# Patient Record
Sex: Female | Born: 1975 | Race: White | Hispanic: No | Smoking: Former smoker
Health system: Southern US, Community
[De-identification: ages and names within clinical notes are randomized; demographics above are authoritative.]

## PROBLEM LIST (undated history)

## (undated) DIAGNOSIS — E039 Hypothyroidism, unspecified: Secondary | ICD-10-CM

## (undated) DIAGNOSIS — O24419 Gestational diabetes mellitus in pregnancy, unspecified control: Secondary | ICD-10-CM

## (undated) DIAGNOSIS — M797 Fibromyalgia: Secondary | ICD-10-CM

## (undated) DIAGNOSIS — Q249 Congenital malformation of heart, unspecified: Secondary | ICD-10-CM

## (undated) DIAGNOSIS — J45909 Unspecified asthma, uncomplicated: Secondary | ICD-10-CM

## (undated) DIAGNOSIS — O99345 Other mental disorders complicating the puerperium: Principal | ICD-10-CM

## (undated) DIAGNOSIS — G809 Cerebral palsy, unspecified: Secondary | ICD-10-CM

## (undated) DIAGNOSIS — F32A Depression, unspecified: Secondary | ICD-10-CM

## (undated) DIAGNOSIS — G930 Cerebral cysts: Secondary | ICD-10-CM

## (undated) DIAGNOSIS — F329 Major depressive disorder, single episode, unspecified: Secondary | ICD-10-CM

## (undated) DIAGNOSIS — E7800 Pure hypercholesterolemia, unspecified: Secondary | ICD-10-CM

## (undated) DIAGNOSIS — F53 Postpartum depression: Secondary | ICD-10-CM

## (undated) HISTORY — DX: Cerebral cysts: G93.0

## (undated) HISTORY — PX: NASAL SINUS SURGERY: SHX719

## (undated) HISTORY — DX: Cerebral palsy, unspecified: G80.9

## (undated) HISTORY — DX: Fibromyalgia: M79.7

## (undated) HISTORY — DX: Gestational diabetes mellitus in pregnancy, unspecified control: O24.419

## (undated) HISTORY — PX: CARDIAC SURGERY: SHX584

## (undated) HISTORY — DX: Other mental disorders complicating the puerperium: O99.345

## (undated) HISTORY — DX: Pure hypercholesterolemia, unspecified: E78.00

## (undated) HISTORY — DX: Hypothyroidism, unspecified: E03.9

## (undated) HISTORY — DX: Postpartum depression: F53.0

---

## 1998-09-23 ENCOUNTER — Other Ambulatory Visit: Admission: RE | Admit: 1998-09-23 | Discharge: 1998-09-23 | Payer: Self-pay | Admitting: Otolaryngology

## 1998-09-23 ENCOUNTER — Encounter (INDEPENDENT_AMBULATORY_CARE_PROVIDER_SITE_OTHER): Payer: Self-pay | Admitting: Specialist

## 2000-11-06 ENCOUNTER — Emergency Department (HOSPITAL_COMMUNITY): Admission: EM | Admit: 2000-11-06 | Discharge: 2000-11-07 | Payer: Self-pay | Admitting: Emergency Medicine

## 2003-06-24 ENCOUNTER — Emergency Department (HOSPITAL_COMMUNITY): Admission: EM | Admit: 2003-06-24 | Discharge: 2003-06-24 | Payer: Self-pay | Admitting: Emergency Medicine

## 2003-11-02 ENCOUNTER — Ambulatory Visit (HOSPITAL_COMMUNITY): Admission: AD | Admit: 2003-11-02 | Discharge: 2003-11-02 | Payer: Self-pay | Admitting: Obstetrics and Gynecology

## 2004-01-12 ENCOUNTER — Inpatient Hospital Stay (HOSPITAL_COMMUNITY): Admission: RE | Admit: 2004-01-12 | Discharge: 2004-01-16 | Payer: Self-pay | Admitting: Obstetrics & Gynecology

## 2007-08-12 ENCOUNTER — Emergency Department (HOSPITAL_COMMUNITY): Admission: EM | Admit: 2007-08-12 | Discharge: 2007-08-12 | Payer: Self-pay | Admitting: Emergency Medicine

## 2007-08-26 ENCOUNTER — Emergency Department (HOSPITAL_COMMUNITY): Admission: EM | Admit: 2007-08-26 | Discharge: 2007-08-26 | Payer: Self-pay | Admitting: Emergency Medicine

## 2007-09-08 ENCOUNTER — Ambulatory Visit: Admission: RE | Admit: 2007-09-08 | Discharge: 2007-09-08 | Payer: Self-pay | Admitting: Neurology

## 2007-09-25 ENCOUNTER — Emergency Department (HOSPITAL_COMMUNITY): Admission: EM | Admit: 2007-09-25 | Discharge: 2007-09-26 | Payer: Self-pay | Admitting: Emergency Medicine

## 2007-12-21 ENCOUNTER — Other Ambulatory Visit: Admission: RE | Admit: 2007-12-21 | Discharge: 2007-12-21 | Payer: Self-pay | Admitting: Obstetrics and Gynecology

## 2008-01-27 ENCOUNTER — Emergency Department (HOSPITAL_COMMUNITY): Admission: EM | Admit: 2008-01-27 | Discharge: 2008-01-27 | Payer: Self-pay | Admitting: Emergency Medicine

## 2008-02-25 ENCOUNTER — Inpatient Hospital Stay (HOSPITAL_COMMUNITY): Admission: EM | Admit: 2008-02-25 | Discharge: 2008-02-29 | Payer: Self-pay | Admitting: Emergency Medicine

## 2008-07-31 ENCOUNTER — Encounter: Admission: RE | Admit: 2008-07-31 | Discharge: 2008-07-31 | Payer: Self-pay | Admitting: Otolaryngology

## 2008-11-16 ENCOUNTER — Emergency Department (HOSPITAL_COMMUNITY): Admission: EM | Admit: 2008-11-16 | Discharge: 2008-11-16 | Payer: Self-pay | Admitting: Emergency Medicine

## 2008-12-23 ENCOUNTER — Other Ambulatory Visit: Admission: RE | Admit: 2008-12-23 | Discharge: 2008-12-23 | Payer: Self-pay | Admitting: Obstetrics & Gynecology

## 2009-03-29 IMAGING — CR DG CHEST 1V PORT
1 series · 1 of 1 positions shown · non-contrast
Comparison: Portable exam 4374 hours compared to 02/25/2008

CLINICAL DATA: Asthma exacerbation

PORTABLE CHEST - 1 VIEW

[view not recorded]
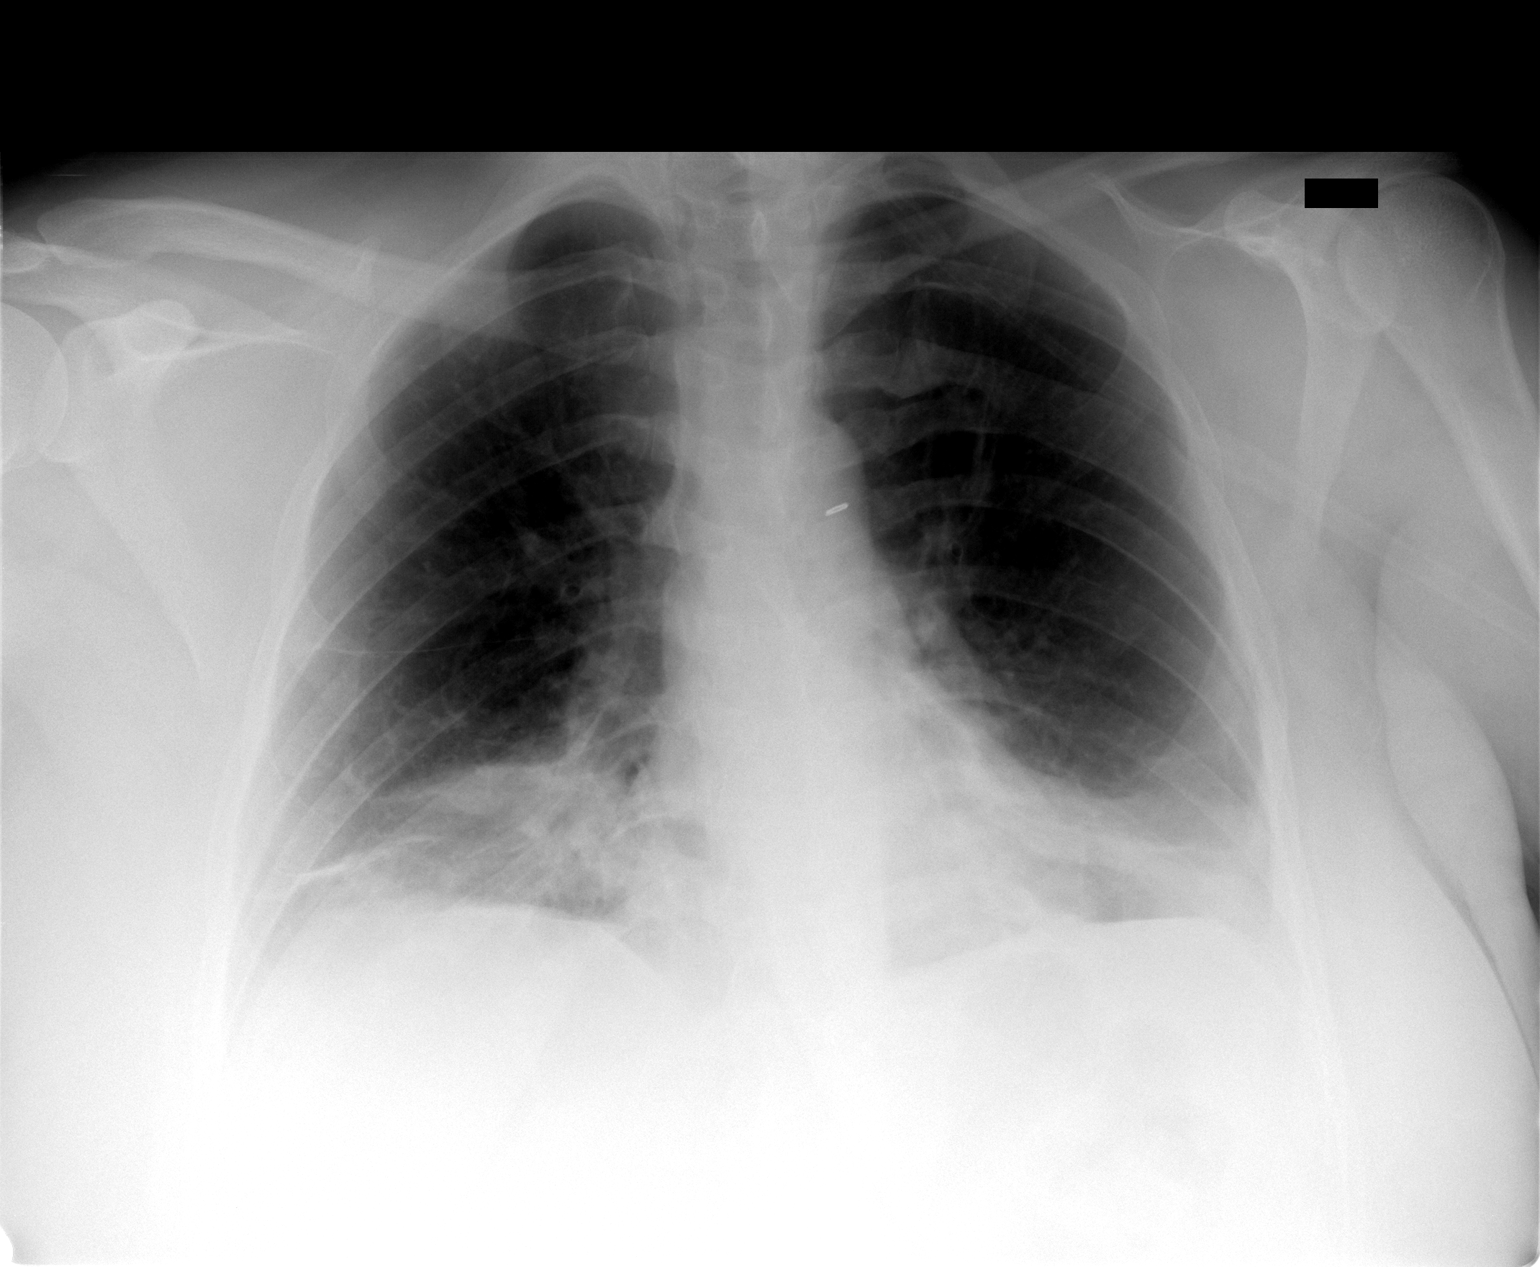

[1 of 1 positions shown; findings below may reference images not displayed]

FINDINGS: Normal heart size, mediastinal contours, and pulmonary vascularity.
Single surgical clip AP window.
Bronchitic changes with decreased lung volumes and bibasilar plates
of atelectasis, new since prior study.
Upper lungs clear.
No pleural effusion or pneumothorax.
IMPRESSION: Decreased lung volumes with large plates of bibasilar atelectasis,
new since prior exam.

## 2010-03-23 ENCOUNTER — Encounter: Payer: Self-pay | Admitting: Otolaryngology

## 2010-05-08 ENCOUNTER — Emergency Department (HOSPITAL_COMMUNITY)
Admission: EM | Admit: 2010-05-08 | Discharge: 2010-05-08 | Disposition: A | Discharge status: Home / Self Care | Payer: Medicaid Other | Attending: Emergency Medicine | Admitting: Emergency Medicine

## 2010-05-08 DIAGNOSIS — M549 Dorsalgia, unspecified: Secondary | ICD-10-CM | POA: Insufficient documentation

## 2010-05-08 LAB — URINALYSIS, ROUTINE W REFLEX MICROSCOPIC
Glucose, UA: NEGATIVE mg/dL
Hgb urine dipstick: NEGATIVE
Ketones, ur: NEGATIVE mg/dL
Nitrite: NEGATIVE
Specific Gravity, Urine: 1.025 (ref 1.005–1.030)
Urobilinogen, UA: 0.2 mg/dL (ref 0.0–1.0)
pH: 6.5 (ref 5.0–8.0)

## 2010-05-08 LAB — POCT PREGNANCY, URINE: Preg Test, Ur: NEGATIVE

## 2010-07-14 NOTE — Discharge Summary (Signed)
NAMELENOLA, LOCKNER              ACCOUNT NO.:  [id number]   MEDICAL RECORD NO.:  [id number]          PATIENT TYPE:  INP   LOCATION:  A303                          FACILITY:  [facility]   PHYSICIAN:  Osvaldo Shipper, MD     DATE OF BIRTH:  Mar 30, 1975   DATE OF ADMISSION:  02/25/2008  DATE OF DISCHARGE:  12/31/2009LH                               DISCHARGE SUMMARY   Please see H&P dictated by Dr. Elige Radon for details regarding the  patient's presenting illness.   PRIMARY MEDICAL DOCTOR:  Dr. Vivia Ewing.   DISCHARGE DIAGNOSES:  1. Acute asthma exacerbation, improving.  2. Hypothyroidism on treatment.  3. History of fibromyalgia and cerebral palsy.   BRIEF HOSPITAL COURSE:  Briefly, this is a 35 year old Caucasian female  who presented to the hospital with complaints of shortness of breath.  The patient underwent chest x-ray which did not show any acute  cardiopulmonary process.  The patient was started on nebulizer  treatment, steroids and antibiotics.  The patient's was very slow to  improve.  She is still wheezing as of today, however, she think she is  close to her baseline.  She has been able to ambulate within her room to  the bathroom with less difficulty.  The only issue that I see is that  the patient has a 33-year-old daughter.  However, the patient tells me  that she will be able to get some family members to help her out.  At  this time, I feel comfortable in discharging this patient.  The rest of  her medical issues remained stable.  Her TSH was elevated at 18.2.  A  repeat test of free T4 and TSH showed that they were both within the  normal range.  Her white cell count elevated most likely from steroids.  ABG did not show any concerning abnormalities except for low pO2 and  that has also improved.  She is saturating at 92% on room air.   The patient has been told that if she gets worse at home she needs to  seek attention immediately.   On the day of discharge, the  patient is feeling well.  She is feeling  better, still wheezing, but denies any new complaints.  Vital signs are  all stable, saturating 92% on room air.  Lungs reveal bilateral wheezing  but much less compared to yesterday.  Cardiac exam was unremarkable.  Abdomen was soft, overall she is stable for discharge.   DISCHARGE MEDICATIONS:  1. Albuterol nebs 2.5 mg four times daily.  2. Prednisone 60 mg daily for 3 days, followed by 40 daily for 3      years, followed by 20 daily for 3 days, followed by 10 daily for 3      days.  3. Amoxicillin 5 mg t.i.d. for 5 days.  4. Levoxyl 125 mg daily.  5. Xanax 1 mg three times a day.  6. Singulair 10 mg daily.  7. Metadate CD one tablet daily.  8. Risperdal 0.25 mg twice a day.  9. Skelaxin 800 mg four times a day.  10.Flexeril 10 mg  twice a day.  11.Albuterol inhaler 2 puffs as needed.  12.Advair discus 250/50 twice a day.  13.She is on a blood control pill, unknown type daily.   FOLLOWUP:  Follow up with Dr. Milford Cage in one week.   DISCHARGE INSTRUCTIONS:  1. Diet as before.  2. Physical activity:  Increase activity slowly.   Total time on this discharge encounter 35 minutes.      Osvaldo Shipper, MD  Electronically Signed     GK/MEDQ  D:  02/29/2008  T:  02/29/2008  Job:  [id number]   cc:   Francoise Schaumann. Milford Cage DO, FAAP  Fax: [phone]

## 2010-07-14 NOTE — Group Therapy Note (Signed)
NAMEARYIA, DELIRA              ACCOUNT NO.:  [id number]   MEDICAL RECORD NO.:  [id number]          PATIENT TYPE:  INP   LOCATION:  A303                          FACILITY:  [facility]   PHYSICIAN:  Skeet Latch, DO    DATE OF BIRTH:  1975-05-10   DATE OF PROCEDURE:  02/27/2008  DATE OF DISCHARGE:                                 PROGRESS NOTE   SUBJECTIVE:  Ms. [name] is a 35 year old Caucasian female who presented  to the ER with shortness of breath. The patient had a history of asthma  and states that she ran out of her inhaler and became severely short of  breath. The patient states she has been having shortness of breath for  over 3 weeks but has not seen her primary care physician. The patient  presented with acute asthma exacerbation and seemed to improve from  yesterday.   OBJECTIVE:  VITAL SIGNS:  Temperature is 97.4, respirations 20, heart  rate 81, blood pressure 120/60, saturating 95% on 5 liters.  CARDIOVASCULAR:  Regular rate and rhythm. No murmurs, rubs, or gallops.  LUNGS:  She has scattered wheezing bilaterally, slight rhonchi, no  rales.  ABDOMEN:  Obese, soft, nontender, nondistended. No rigidity or guarding.  EXTREMITIES:  No clubbing, cyanosis, or edema.   LABORATORY DATA:  Hemoglobin 12.6, hematocrit 37.4, white count 17.3,  platelet count 387,000. Sodium 138, potassium 4, chloride 114, CO2 19,  BUN 10, creatinine 0.73, glucose 147. TSH is 18.24. Chest x-ray showed  decreased lung volumes with large plates of bibasilar atelectasis - new  since prior exam.   ASSESSMENT AND PLAN:  1. Acute exacerbation of asthma. For this, we continued with nebulizer      treatments and IV steroids and expectorants at this time.  2. Leukocytosis, probably secondary to steroids. Will continue to      monitor closely. The patient is on broad-spectrum antibiotics. Will      continue that at this time.  3. The patient will continue on deep vein thrombosis as well as  gastrointestinal prophylaxis.      Skeet Latch, DO  Electronically Signed     SM/MEDQ  D:  02/27/2008  T:  02/27/2008  Job:  [id number] [id number]

## 2010-07-14 NOTE — H&P (Signed)
[name], [name]              ACCOUNT NO.:  [id number]   MEDICAL RECORD NO.:  [id number]          PATIENT TYPE:  INP   LOCATION:  IC02                          FACILITY:  [facility]   PHYSICIAN:  Melanie Singh, DO    DATE OF BIRTH:  1976-01-09   DATE OF ADMISSION:  02/25/2008  DATE OF DISCHARGE:  LH                              HISTORY & PHYSICAL   CHIEF COMPLAINT:  Shortness of breath.   HISTORY OF PRESENT ILLNESS:  Patient is a 35 year old Caucasian female  who presented to the emergency room with increasing difficulty with  shortness of breath.  She has never had a history of being intubated  before.  Primary care physician is Dr. Milinda Monroe.  Stated that it started,  she had no relief with the usual things that she would do to stop.  At  that point in time, she came to be evaluated by the emergency room for  breathing treatment.  She has been seen in the emergency room for  bronchitis in the past and asthma.   PAST MEDICAL HISTORY:  1. Asthma.  2. Cerebral palsy.  3. Fibromyalgia.  4. Hypothyroidism.   SURGICAL HISTORY:  1. Sinus windows.  2. Open heart surgery.  3. 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   SOCIAL HISTORY:  She is a nonsmoker and non-drug abuse.  Occasional  drinker.  She is on birth control pills.  She has no special needs.   She has an allergy to MIRAPEX, which she gets a rash.  CIPRO, which  causes nausea and vomiting.  EFFEXOR, which causes nausea and vomiting.  It is one of the [facility].   CURRENT MEDICATIONS:  1. Lovenox 125 mcg once daily.  2. Xanax 3 times daily.  3. Singulair 10 mg once daily.  4. Metadate CD questionable 40 mg once daily.  5. Risperdal 0.25 mg twice daily.  6. Skelaxin 800 mg 4 times daily.  7. Flexeril 10 mg twice daily.  8. Albuterol inhaler 2 puffs as needed.  9. Advair Discus 250/50 twice daily.  10.She is on a blood pressure/fluid pill but does not have the name.      I have put an order in for the nurses to  contact her pharmacy for      the correct listing of her medication.   REVIEW OF SYSTEMS:  CONSTITUTIONAL:  No fever, chills, or fatigue.  HEENT:  All negative.  RESPIRATORY:  Positive for dyspnea, wheezing, and  productive cough.  HEART:  No chest pain.  GI:  Negative.  GU:  Negative.  NEUROLOGIC:  Negative.  HEMATOLOGIC:  Negative.   PHYSICAL EXAMINATION:  CURRENT VITALS:  Temperature 97.4, pulse 112,  respirations 20, blood pressure 122/69.  GENERAL:  Patient is well-developed and well-nourished in no acute  distress.  Head is normocephalic and atraumatic.  Eyes are PERRL.  EOMI.  Ears,  nose, mouth, and throat are all within normal limits.  NECK:  Supple.  Full range of motion.  No meningeal signs.  CARDIOVASCULAR:  Regular rate and rhythm.  No murmur, rub or gallop.  RESPIRATORY:  Wheeze on expiration.  No rales or rhonchi.  ABDOMEN:  Soft, nontender, nondistended.  No masses, organomegaly,  rebound, or rigidity.  GU:  No CVA tenderness.  EXTREMITIES:  Positive pulses.  No ecchymosis or cyanosis.   LABS:  ABG:  7.419, pCO2 29.5, pO2 56.8, bicarbonate 18.8.  White count  13.8, hemoglobin 14, hematocrit 41.4, platelet count 383.  Sodium 137,  potassium 3.7, chloride 104, CO2 21, glucose 101, BUN 10, creatinine  0.74.   ASSESSMENT:  Acute exacerbation of asthma.   PLAN:  Admit patient to the service of [facility].  We will start her on  nebulizer treatment as well as IV steroids and expectorants and IV  fluids.  At this point in time, we will continue to monitor her white  count.  For her leukocytosis, we will go ahead and start her on a broad-  spectrum antibiotic and will do DVT and GI prophylaxis.  We will place  her on all of her home medications.  We will continue to monitor and  make changes as necessary.      Melanie Singh, DO  Electronically Signed     CB/MEDQ  D:  02/26/2008  T:  02/26/2008  Job:  [id number]

## 2010-07-14 NOTE — Procedures (Signed)
[name], KOPE              ACCOUNT NO.:  [id number]   MEDICAL RECORD NO.:  [id number]          PATIENT TYPE:  OUT   LOCATION:  SLEE                          FACILITY:  [facility]   PHYSICIAN:  Kofi A. Gerilyn Pilgrim, M.D. DATE OF BIRTH:  09-Oct-1975   DATE OF PROCEDURE:  09/08/2007  DATE OF DISCHARGE:  09/08/2007                             SLEEP DISORDER REPORT   REASON FOR COMPLAINT:  Snoring and insomnia.   INDICATION:  A 35 year old female.  She has been evaluated for possible  obstructive sleep apnea syndrome.   MEDICATIONS:  Skelaxin, Proventil, Singulair, Flexeril, albuterol,  Seroquel, levothyroxine, and Xanax.   Epworth sleepiness scale 11.  BMI 32.   SLEEP STAGE SUMMARY:  The total recording time is 423 minutes.  Sleep  efficiency 95%.  Sleep latency 11 minutes.  REM latency 54 minutes.  Stage N1 2.6%, N2 52%, N3 18%, and a REM sleep 27%.   RESPIRATORY SUMMARY:  AHI is 1.8.  Baseline oxygen saturation is 99%.  Lowest oxygen saturation 93%.   LIMB MOVEMENT SUMMARY:  The PLM index is 24.   ELECTROCARDIOGRAM SUMMARY:  No significant dysrhythmias observed.  Average heart rate.   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   Thanks for this referral.      Kofi A. Gerilyn Pilgrim, M.D.  Electronically Signed     KAD/MEDQ  D:  09/15/2007  T:  09/16/2007  Job:  [id number]

## 2010-07-17 NOTE — Op Note (Signed)
[name], [name]                ACCOUNT NO.:  [id number]   MEDICAL RECORD NO.:  [id number]          PATIENT TYPE:  INP   LOCATION:  A413                          FACILITY:  [facility]   PHYSICIAN:  Tilda Burrow, M.D. DATE OF BIRTH:  09-04-1975   DATE OF PROCEDURE:  DATE OF DISCHARGE:                                 OPERATIVE REPORT   PREOPERATIVE DIAGNOSES:  1.  Pregnancy at 39 weeks.  2.  Failed induction.  3.  Cephalopelvic disproportion.   POSTOPERATIVE DIAGNOSES:  1.  Pregnancy at 39 weeks.  2.  Failed induction.  3.  Cephalopelvic disproportion.  4.  Chorioamnionitis.   PROCEDURES:  1.  Primary low transverse cervical cesarean section.  2.  Excision of redundant skin and subcutaneous tissues.   SURGEON:  Tilda Burrow, M.D.   ASSISTANT:  Zerita Boers, N.M.   ANESTHESIA:  Epidural, Dillard Cannon, C.R.N.A., supplementing placed earlier  by Lazaro Arms, M.D.   COMPLICATIONS:  None.   FINDINGS:  A 9 pound 11 ounce female infant, Apgars 9 and 9.  Malodorous  amniotic fluid suggestive of chorioamnionitis, cultures obtained.   INDICATIONS:  This 35 year old primiparous female reached complete dilation,  was allowed epidural slide for two hours, then pushed from 2:30 until 4:45,  2-1/4 hours, before consideration of interventions were discussed, and the  patient had declined consideration of vacuum assistance.   DETAILS OF PROCEDURE:  The patient was taken to the operating room, prepped  and draped with epidural effective.  The patient specifically requested  while fully alert and comfortable that part of her abdominal wall laxity be  trimmed as a part of the access site.  We agreed to this with multiple  witnesses.  We then proceeded with opening the abdomen with Pfannenstiel-  type incision, with extra length of approximately 40 cm, a 35-40 cm  incision.  The abdominal cavity was entered without difficulty and the  bladder flap found to be located over the  well-developed lower uterine  segment.  The bladder flap was pulled inferiorly and transverse incision  made at the level of the cervix with fetal vertex found to be slightly  asynclitic and occiput transverse.  This was rotated into the incision and  delivered with fundal pressure combined with manual guidance to the vertex.  Malodor was noted.  The infant cried promptly, Apgars 9/9 assigned.  Dr.  Webb Laws notes dictated elsewhere describe baby's care.   The cord blood samples were obtained, placenta delivered intact, Schultze  presentation, with cord blood gases pending at this time.  The placenta was  sent for histology.  Cultures were obtained from the amniotic fluid prior to  delivery of the placenta.  The uterus was irrigated copiously with  antibiotic-containing solution and then the IV antibiotics given as well.  Ancef 1 g was used.  A two-layer running locking closure of the uterus was  performed with the second imbricating layer being running closure.  The  hemostasis was quite good.  The bladder flap was loosely reapproximated with  2-0 chromic, the abdomen irrigated in the upper abdomen  with antibiotic  solution, and then the anterior peritoneum closed with 2-0 chromic.  The  rectus muscles were loosely approximated with two interrupted sutures of 2-0  chromic and then the fascia closed.  Prior to closure of the fascia, we  trimmed off the redundant skin and fatty tissue which had been marked prior  to surgery.  We removed an approximately 10 cm wide by 30-35 cm wide ellipse  of skin and underlying subcu tissues, then used cautery to achieve  hemostasis, closed the fascia, then used interrupted 2-0 plain to  reapproximate the subcu fatty tissue, placed a subcu JP drain, which was  allowed to exit through the left side of the incision, and placed staples in  the skin to result in good tissue approximation with satisfactory cosmetic  result.  The patient tolerated the procedure  well and went to the recovery  room in good condition.   ADDENDUM:  The patient's temperature in recovery was 102.4.  She will  receive one additional dose of IV antibiotics.     John   JVF/MEDQ  D:  01/13/2004  T:  01/14/2004  Job:  [id number]   cc:   Francoise Schaumann. Halm, D.O.  [address]  [place]  [place] [address]  Fax: [phone]

## 2010-07-17 NOTE — Op Note (Signed)
[name], [name]                ACCOUNT NO.:  [id number]   MEDICAL RECORD NO.:  [id number]          PATIENT TYPE:  INP   LOCATION:  LDR1                          FACILITY:  [facility]   PHYSICIAN:  Lazaro Arms, M.D.   DATE OF BIRTH:  Jul 19, 1975   DATE OF PROCEDURE:  01/13/2004  DATE OF DISCHARGE:                                 OPERATIVE REPORT   PROCEDURE:  Epidural note.   [name] is a 35 year old gravida 1 at 5 cm dilation, requesting an epidural  to be placed.  She is placed in sitting position.  The L3-4 interspace is  identified and the midline is identified.  The area is Betadine-prepped, a  field drape is placed.  Lidocaine 1% is injected as a local anesthetic.  A  17-gauge Tuohy needle is used and a loss of resistance technique employed,  and the epidural space is found with one pass without difficulty.  Bupivacaine 0.125% 10 mL is given as a test dose without ill effects.  An  additional 10 mL is given to dose up the epidural.  The continuous infusion  of 0.125% bupivacaine and 2 mcg/mL of fentanyl is begun at 12 mL/hr.  The  patient has an adequate blood pressure and reactive NST.  She is without  complaints.     Luth   LHE/MEDQ  D:  01/13/2004  T:  01/13/2004  Job:  [id number]

## 2010-07-17 NOTE — Discharge Summary (Signed)
NAMEKRISSIA, Monroe                ACCOUNT NO.:  [id number]   MEDICAL RECORD NO.:  [id number]          PATIENT TYPE:  INP   LOCATION:  A413                          FACILITY:  [facility]   PHYSICIAN:  Tilda Burrow, M.D. DATE OF BIRTH:  07-Oct-1975   DATE OF ADMISSION:  01/12/2004  DATE OF DISCHARGE:  11/17/2005LH                                 DISCHARGE SUMMARY   ADMITTING DIAGNOSES:  1.  Pregnancy 39-1/[redacted] weeks gestation.  2.  Favorable cervix.  Elective induction of labor.   DISCHARGE DIAGNOSES:  1.  Pregnancy 39 weeks.  2.  Cephalopelvic disproportion, chorioamnionitis, resolved.   PROCEDURES:  1.  November 13, Foley bulb cervical ripening.  2.  November 14, Pitocin induction of labor.  3.  November 14, primary low-transverse cervical cesarean section, wide      excision of fatty skin and tissue (Media).   DISCHARGE MEDICATIONS:  1.  Tylox two q.6h. p.r.n. pain.  2.  Motrin 800 mg q.6h. p.r.n. mild pain.  3.  HCTZ 25 mg p.o. daily x2 weeks.  4.  Levaquin 500 mg daily x7 days.   HOSPITAL SUMMARY:  This 35 year old primiparous female was admitted for  induction of labor with cervical __________ .  She had an estimated fetal  weight of 8 pounds.  Cervix was unfavorable at 3 cm, 75% effaced, -1 station  at admission.  Prenatal course is documented by Dr. Despina Hidden in his admitting  note.   HOSPITAL COURSE:  The patient was admitted and had balloon cervical ripening  overnight and Pitocin induction on January 13, 2004.  The patient  progressed in labor to completely dilated, pushed for quite some time.  She  had two hours of epidural slide followed by 2-1/2 hours of pushing.  Vertex  was at +3 station, but the patient declined offer of vacuum assistance which  turned out to be a good idea as the infant weighed 8 pounds, 11 ounces.  The  patient also had excision of a wide ellipse of redundant skin and fatty  tissue as a courtesy portion of the procedure.  This was approximately 40  cm  long incision.  She had subcutaneously JP drain placed in the subcutaneous  space.  Postpartum, the patient remained stable.  She tolerated a regular  diet.  She had a discharge hematocrit of 30, hemoglobin 10.6 compared to  36.1 hematocrit and 12.6 hemoglobin on admission.  Maternal blood type is O  positive.  The patient had cultures performed at the time of the delivery  due to suspected chorioamnionitis, and had an additional day of antibiotics.  The cultures, a gram smear was performed which showed a few white cells  present  with gram negative rods and gram positive cocci seen in pairs on the gram  stain.  The cultures nonetheless turned out negative.  The patient was  therefore considered stable for discharge, afebrile, with a regular  uncomplicated postpartum course, and discharged on __________ .     John   JVF/MEDQ  D:  01/16/2004  T:  01/16/2004  Job:  [id number]   cc:  Francoise Schaumann. Milford Cage, M.D.  [address]

## 2010-07-17 NOTE — H&P (Signed)
NAME:  [name], [name] NO.:  [id number]   MEDICAL RECORD NO.:  [id number]          PATIENT TYPE:  INP   LOCATION:  LDR1                          FACILITY:  [facility]   PHYSICIAN:  Lazaro Arms, M.D.   DATE OF BIRTH:  1976/02/28   DATE OF ADMISSION:  01/12/2004  DATE OF DISCHARGE:  LH                                HISTORY & PHYSICAL   [name] is a 35 year old white female gravida 1 para 0 with an estimated date  of delivery of 01/16/04 currently at 39-1/[redacted] weeks gestation who has a very  favorable cervix in the office of 350 and -1 station, vertex, soft, and  slightly posterior.  She is admitted for Foley bulb ripening and induction  of labor.  She does have a history of cerebral palsy affecting the left side  as she was one of a premature twin, and her twin actually passed away at the  very early neonatal age.  Otherwise, her past medical history and her OB  history has been benign.   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   ALLERGIES:  None.   MEDICATIONS:  Prenatal vitamins and iron.   Blood type is O positive.  Antibody screen is negative.  Drug screen is  negative.  Rubella is immune.  Hepatitis B and HIV were both negative.  Serology was nonreactive.  Pap smear was normal. GC and chlamydia were  negative x2.  Her AFP was negative.  Her group B strep was negative.  Glucola is 105.  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  She has 500 of Valtrex a day.  She has never  had any lesions and denies any symptoms and has no lesions at this time.   PHYSICAL EXAMINATION:  HEENT:  Unremarkable.  Thyroid is normal.  Oropharynx  is clear.  RESPIRATORY:  Lungs are clear.  CARDIOVASCULAR:  Heart is regular rhythm without murmurs, rubs, or gallops.  BREASTS:  Deferred.  ABDOMEN:  Fundal height of 37 cm.  Cervix is 375 and -1.  EXTREMITIES:  Warm with no  edema.  NEUROLOGIC:  Grossly intact.   IMPRESSION:  1.  Intrauterine pregnancy at 39-1/[redacted] weeks gestation.  2. Favorable cervix.   PLAN:  The patient is admitted for Foley bulb ripening and induction of  labor.  She understands the risks, benefits, indications, alternatives, and  will proceed.    Luth  LHE/MEDQ  D:  01/13/2004  T:  01/13/2004  Job:  [id number]

## 2010-07-17 NOTE — Discharge Summary (Signed)
NAME:  [name], [name]                        ACCOUNT NO.:  [id number]   MEDICAL RECORD NO.:  [id number]                   PATIENT TYPE:  OIB   LOCATION:  A415                                 FACILITY:  [facility]   PHYSICIAN:  Langley Gauss, M.D.                DATE OF BIRTH:  Jul 15, 1975   DATE OF ADMISSION:  11/02/2003  DATE OF DISCHARGE:  11/02/2003                                 DISCHARGE SUMMARY   HISTORY OF PRESENT ILLNESS:  This is a 35 year old gravida 1, para 0 at  about [redacted] weeks gestation, who presents to [facility] with the chief  complaint of vaginal bleeding post sex today.  She states about at about  1200, she had sexual intercourse with her boyfriend, following which she  noted about an orange-size red blood spotting area on the bed sheets.  Her  boyfriend likewise noticed some red blood staining on his penis.  Subsequently, she states she had no onset of cramping.  She had no leakage  of clear fluid.  Pertinently, she had engaged in intercourse about 1-1/2  weeks previously with no untoward effects.  She states her prenatal course  to date has been uncomplicated.  She denies any first trimester vaginal  bleeding.  She denies any history of low-lying placenta.  Prenatal record is  not available on labor and delivery on today's date.   PAST MEDICAL HISTORY:  The patient's past medical history is otherwise  negative.   CURRENT MEDICATIONS:  1.  Prenatal vitamins.  2.  In addition, she is noted to have a history of asthma.  She has required      steroid treatments on several incidences during this pregnancy.   PHYSICAL EXAMINATION:  GENERAL:  She is noted to be in no acute distress.  VITAL SIGNS:  Blood pressure 132/73, respiratory rate 20, pulse 113,  temperature 97.7.  HEENT:  Negative.  NECK:  Supple.  LUNGS:  Clear.  CARDIOVASCULAR EXAM:  Regular rate and rhythm.  ABDOMEN:  Soft, nontender.  Uterine tone is normal.  Fundal height 32 cm.  EXTREMITIES:   Noted to be normal.  PELVIC:  Normal external genitalia.  No evidence of active bleeding in the  external vulva.  Sterile speculum examination is performed.  Very small  mucusy clot is present at the endocervical os.  This is removed.  There  appears to be some bright red active bleeding occurring from the cervix  only.  Gentle pressure is applied with a 4 x 4 sponge which markedly  diminishes the bleeding as this appears to be cervical bleeding as trauma  post intercourse.   External fetal monitor non-stress test is performed and reveals fetal heart  beats 145 with accelerations noted greater than 15 beats per minute x  greater than 15 seconds duration.  No fetal heart rate decelerations, normal  long-term variability.  External toco reveals no evidence of any uterine  activity present.   PLAN:  At present, a vaginal packing is placed to provide pressure to the  cervix itself.  The patient is advised to remove this in 1-1/2 hour's time.  She is advised to continue with fetal kick counts.  She is advised that  worrisome problems with the pregnancy would be renewed onset of vaginal  bleeding, vaginal cramping, or increased uterine tone.   The patient does have an appointment at [facility] in five-days'  time which she is advised to keep.  She is noted to be Rh positive blood  type.   PROCEDURES:  Non-stress test interpretation, reactive.   FINAL DIAGNOSES:  1.  A 29-week intrauterine pregnancy.  2.  Vaginal bleeding, likely postcoital in nature.     ___________________________________________                                         Langley Gauss, M.D.   DC/MEDQ  D:  11/02/2003  T:  11/02/2003  Job:  [id number]

## 2010-11-27 LAB — BASIC METABOLIC PANEL
Calcium: 8.5
GFR calc Af Amer: >60
Potassium: 3.2 — ABNORMAL LOW

## 2010-12-04 LAB — BASIC METABOLIC PANEL
BUN: 10 mg/dL (ref 6–23)
BUN: 9 mg/dL (ref 6–23)
CO2: 19 mEq/L (ref 19–32)
CO2: 22 mEq/L (ref 19–32)
CO2: 22 mEq/L (ref 19–32)
Calcium: 9 mg/dL (ref 8.4–10.5)
Calcium: 9.3 mg/dL (ref 8.4–10.5)
Chloride: 111 mEq/L (ref 96–112)
Chloride: 114 mEq/L — ABNORMAL HIGH (ref 96–112)
Creatinine, Ser: 0.77 mg/dL (ref 0.4–1.2)
Creatinine, Ser: 0.88 mg/dL (ref 0.4–1.2)
GFR calc Af Amer: >60 mL/min (ref >60)
GFR calc Af Amer: >60 mL/min (ref >60)
GFR calc Af Amer: >60 mL/min (ref >60)
GFR calc Af Amer: >60 mL/min (ref >60)
GFR calc non Af Amer: >60 mL/min (ref >60)
GFR calc non Af Amer: >60 mL/min (ref >60)
GFR calc non Af Amer: >60 mL/min (ref >60)
Glucose, Bld: 147 mg/dL — ABNORMAL HIGH (ref 70–99)
Glucose, Bld: 161 mg/dL — ABNORMAL HIGH (ref 70–99)
Potassium: 3.8 mEq/L (ref 3.5–5.1)
Potassium: 3.9 mEq/L (ref 3.5–5.1)
Sodium: 137 mEq/L (ref 135–145)
Sodium: 140 mEq/L (ref 135–145)
Sodium: 140 mEq/L (ref 135–145)

## 2010-12-04 LAB — DIFFERENTIAL
Basophils Absolute: 0 10*3/uL (ref 0.0–0.1)
Basophils Absolute: 0 10*3/uL (ref 0.0–0.1)
Basophils Relative: 0 % (ref 0–1)
Basophils Relative: 0 % (ref 0–1)
Basophils Relative: 0 % (ref 0–1)
Eosinophils Absolute: 0 10*3/uL (ref 0.0–0.7)
Eosinophils Relative: 12 % — ABNORMAL HIGH (ref 0–5)
Lymphocytes Relative: 12 % (ref 12–46)
Lymphs Abs: 1.8 10*3/uL (ref 0.7–4.0)
Lymphs Abs: 2.1 10*3/uL (ref 0.7–4.0)
Lymphs Abs: 2.2 10*3/uL (ref 0.7–4.0)
Lymphs Abs: 2.3 10*3/uL (ref 0.7–4.0)
Lymphs Abs: 4.4 10*3/uL — ABNORMAL HIGH (ref 0.7–4.0)
Monocytes Absolute: 0.8 10*3/uL (ref 0.1–1.0)
Monocytes Absolute: 1 10*3/uL (ref 0.1–1.0)
Monocytes Absolute: 1.4 10*3/uL — ABNORMAL HIGH (ref 0.1–1.0)
Monocytes Relative: 6 % (ref 3–12)
Monocytes Relative: 8 % (ref 3–12)
Monocytes Relative: 8 % (ref 3–12)
Neutro Abs: 14.6 10*3/uL — ABNORMAL HIGH (ref 1.7–7.7)
Neutro Abs: 14.7 10*3/uL — ABNORMAL HIGH (ref 1.7–7.7)
Neutro Abs: 3.7 10*3/uL (ref 1.7–7.7)
Neutro Abs: 6.4 10*3/uL (ref 1.7–7.7)
Neutrophils Relative %: 47 % (ref 43–77)
Neutrophils Relative %: 57 % (ref 43–77)
Neutrophils Relative %: 81 % — ABNORMAL HIGH (ref 43–77)
Neutrophils Relative %: 82 % — ABNORMAL HIGH (ref 43–77)
Neutrophils Relative %: 85 % — ABNORMAL HIGH (ref 43–77)

## 2010-12-04 LAB — CBC
HCT: 35 % — ABNORMAL LOW (ref 36.0–46.0)
HCT: 36.5 % (ref 36.0–46.0)
HCT: 41.4 % (ref 36.0–46.0)
Hemoglobin: 11.5 g/dL — ABNORMAL LOW (ref 12.0–15.0)
Hemoglobin: 12.6 g/dL (ref 12.0–15.0)
Hemoglobin: 12.7 g/dL (ref 12.0–15.0)
MCHC: 32.9 g/dL (ref 30.0–36.0)
MCHC: 33.4 g/dL (ref 30.0–36.0)
MCHC: 33.8 g/dL (ref 30.0–36.0)
MCHC: 33.8 g/dL (ref 30.0–36.0)
MCV: 87.1 fL (ref 78.0–100.0)
MCV: 87.2 fL (ref 78.0–100.0)
MCV: 88 fL (ref 78.0–100.0)
Platelets: 247 10*3/uL (ref 150–400)
Platelets: 383 10*3/uL (ref 150–400)
RBC: 4.36 MIL/uL (ref 3.87–5.11)
RDW: 12.9 % (ref 11.5–15.5)
WBC: 13.8 10*3/uL — ABNORMAL HIGH (ref 4.0–10.5)
WBC: 17.3 10*3/uL — ABNORMAL HIGH (ref 4.0–10.5)
WBC: 17.8 10*3/uL — ABNORMAL HIGH (ref 4.0–10.5)
WBC: 18.2 10*3/uL — ABNORMAL HIGH (ref 4.0–10.5)

## 2010-12-04 LAB — BLOOD GAS, ARTERIAL
Acid-base deficit: 7 mmol/L — ABNORMAL HIGH (ref 0.0–2.0)
O2 Content: 4 L/min
O2 Content: 5 L/min
O2 Saturation: 89.2 %
pCO2 arterial: 28 mmHg — ABNORMAL LOW (ref 35.0–45.0)
pCO2 arterial: 29.5 mmHg — ABNORMAL LOW (ref 35.0–45.0)
pH, Arterial: 7.384 (ref 7.350–7.400)
pH, Arterial: 7.419 — ABNORMAL HIGH (ref 7.350–7.400)

## 2010-12-04 LAB — PROTIME-INR: INR: 1 (ref 0.00–1.49)

## 2010-12-04 LAB — APTT: aPTT: 28 seconds (ref 24–37)

## 2010-12-04 LAB — TSH: TSH: 0.367 u[IU]/mL (ref 0.350–4.500)

## 2011-08-04 ENCOUNTER — Other Ambulatory Visit: Payer: Self-pay | Admitting: Adult Health

## 2011-08-04 DIAGNOSIS — L0291 Cutaneous abscess, unspecified: Secondary | ICD-10-CM

## 2011-08-05 ENCOUNTER — Other Ambulatory Visit: Payer: Self-pay | Admitting: Adult Health

## 2011-08-05 DIAGNOSIS — L0291 Cutaneous abscess, unspecified: Secondary | ICD-10-CM

## 2011-08-11 ENCOUNTER — Other Ambulatory Visit (HOSPITAL_COMMUNITY): Payer: Self-pay | Admitting: Adult Health

## 2011-08-11 ENCOUNTER — Ambulatory Visit (HOSPITAL_COMMUNITY)
Admission: RE | Admit: 2011-08-11 | Discharge: 2011-08-11 | Disposition: A | Discharge status: Home / Self Care | Payer: Medicaid Other | Source: Ambulatory Visit | Attending: Adult Health | Admitting: Adult Health

## 2011-08-11 ENCOUNTER — Ambulatory Visit (HOSPITAL_COMMUNITY): Payer: Medicaid Other

## 2011-08-11 DIAGNOSIS — L0291 Cutaneous abscess, unspecified: Secondary | ICD-10-CM

## 2011-08-11 DIAGNOSIS — N61 Mastitis without abscess: Secondary | ICD-10-CM | POA: Insufficient documentation

## 2012-02-01 ENCOUNTER — Other Ambulatory Visit: Payer: Self-pay | Admitting: Family Medicine

## 2012-02-01 ENCOUNTER — Other Ambulatory Visit (HOSPITAL_COMMUNITY)
Admission: RE | Admit: 2012-02-01 | Discharge: 2012-02-01 | Disposition: A | Discharge status: Home / Self Care | Payer: Medicaid Other | Source: Ambulatory Visit | Attending: Obstetrics and Gynecology | Admitting: Obstetrics and Gynecology

## 2012-02-01 DIAGNOSIS — Z01419 Encounter for gynecological examination (general) (routine) without abnormal findings: Secondary | ICD-10-CM | POA: Insufficient documentation

## 2012-02-01 DIAGNOSIS — Z113 Encounter for screening for infections with a predominantly sexual mode of transmission: Secondary | ICD-10-CM | POA: Insufficient documentation

## 2012-02-01 LAB — OB RESULTS CONSOLE ABO/RH: RH Type: POSITIVE

## 2012-02-01 LAB — OB RESULTS CONSOLE HIV ANTIBODY (ROUTINE TESTING): HIV: NONREACTIVE

## 2012-02-01 LAB — OB RESULTS CONSOLE RUBELLA ANTIBODY, IGM: Rubella: IMMUNE

## 2012-02-01 LAB — OB RESULTS CONSOLE HEPATITIS B SURFACE ANTIGEN: Hepatitis B Surface Ag: NEGATIVE

## 2012-04-04 ENCOUNTER — Encounter (HOSPITAL_COMMUNITY): Payer: Self-pay | Admitting: Emergency Medicine

## 2012-04-04 ENCOUNTER — Emergency Department (HOSPITAL_COMMUNITY)
Admission: EM | Admit: 2012-04-04 | Discharge: 2012-04-04 | Disposition: A | Discharge status: Home / Self Care | Payer: Medicaid Other | Attending: Emergency Medicine | Admitting: Emergency Medicine

## 2012-04-04 DIAGNOSIS — J9801 Acute bronchospasm: Secondary | ICD-10-CM | POA: Insufficient documentation

## 2012-04-04 DIAGNOSIS — J45909 Unspecified asthma, uncomplicated: Secondary | ICD-10-CM | POA: Insufficient documentation

## 2012-04-04 HISTORY — DX: Unspecified asthma, uncomplicated: J45.909

## 2012-04-04 MED ORDER — ALBUTEROL SULFATE (5 MG/ML) 0.5% IN NEBU
5.0000 mg | INHALATION_SOLUTION | Freq: Once | RESPIRATORY_TRACT | Status: AC
  Administered 2012-04-04: 5 mg via RESPIRATORY_TRACT
  Filled 2012-04-04: qty 1

## 2012-04-04 MED ORDER — METHYLPREDNISOLONE SODIUM SUCC 125 MG IJ SOLR
125.0000 mg | Freq: Once | INTRAMUSCULAR | Status: AC
  Administered 2012-04-04: 125 mg via INTRAMUSCULAR

## 2012-04-04 MED ORDER — IPRATROPIUM BROMIDE 0.02 % IN SOLN
0.5000 mg | Freq: Once | RESPIRATORY_TRACT | Status: AC
  Administered 2012-04-04: 0.5 mg via RESPIRATORY_TRACT
  Filled 2012-04-04: qty 2.5

## 2012-04-04 MED ORDER — AMOXICILLIN 500 MG PO CAPS: 500.0000 mg | ORAL_CAPSULE | Freq: Three times a day (TID) | ORAL | Status: DC

## 2012-04-04 MED ORDER — METHYLPREDNISOLONE SODIUM SUCC 125 MG IJ SOLR
125.0000 mg | Freq: Once | INTRAMUSCULAR | Status: DC
  Filled 2012-04-04: qty 2

## 2012-04-04 MED ORDER — PREDNISONE 10 MG PO TABS: 20.0000 mg | ORAL_TABLET | Freq: Every day | ORAL | Status: DC

## 2012-04-04 NOTE — ED Provider Notes (Signed)
History     CSN: [id number]  Arrival date & time 04/04/12  2001   First MD Initiated Contact with Patient 04/04/12 2015      Chief Complaint  Patient presents with  . Shortness of Breath    (Consider location/radiation/quality/duration/timing/severity/associated sxs/prior treatment) Patient is a 37 y.o. female presenting with shortness of breath. The history is provided by the patient (pt complains of cough wheezing and sob). No language interpreter was used.  Shortness of Breath  The current episode started yesterday. The problem occurs frequently. The problem has been unchanged. The problem is moderate. Nothing relieves the symptoms. Nothing aggravates the symptoms. Associated symptoms include shortness of breath and wheezing. Pertinent negatives include no chest pain, no chest pressure and no cough.    Past Medical History  Diagnosis Date  . Asthma     Past Surgical History  Procedure Date  . Cardiac surgery   . Nasal sinus surgery     History reviewed. No pertinent family history.  History  Substance Use Topics  . Smoking status: Not on file  . Smokeless tobacco: Not on file  . Alcohol Use: No    OB History    Grav Para Term Preterm Abortions TAB SAB Ect Mult Living   1               Review of Systems  Constitutional: Negative for fatigue.  HENT: Negative for congestion, sinus pressure and ear discharge.   Eyes: Negative for discharge.  Respiratory: Positive for shortness of breath and wheezing. Negative for cough.   Cardiovascular: Negative for chest pain.  Gastrointestinal: Negative for abdominal pain and diarrhea.  Genitourinary: Negative for frequency and hematuria.  Musculoskeletal: Negative for back pain.  Skin: Negative for rash.  Neurological: Negative for seizures and headaches.  Hematological: Negative.   Psychiatric/Behavioral: Negative for hallucinations.    Allergies  Doxycycline and Mirapex  Home Medications   Current Outpatient Rx   Name  Route  Sig  Dispense  Refill  . ACETAMINOPHEN 500 MG PO TABS   Oral   Take 1,000 mg by mouth 2 (two) times daily as needed. For pain         . ALBUTEROL SULFATE HFA 108 (90 BASE) MCG/ACT IN AERS   Inhalation   Inhale 2 puffs into the lungs every 4 (four) hours as needed.         . ALBUTEROL SULFATE (2.5 MG/3ML) 0.083% IN NEBU   Nebulization   Take 5 mg by nebulization every 4 (four) hours as needed.         Marland Kitchen FLUTICASONE-SALMETEROL 500-50 MCG/DOSE IN AEPB   Inhalation   Inhale 1 puff into the lungs every 12 (twelve) hours.         Marland Kitchen LEVOTHYROXINE SODIUM 200 MCG PO TABS   Oral   Take 200 mcg by mouth daily. **Takes with tablet for a total of daily*         . LEVOTHYROXINE SODIUM 25 MCG PO TABS   Oral   Take 25 mcg by mouth daily. Taken with tablet for a total of daily         . OSELTAMIVIR PHOSPHATE 75 MG PO CAPS   Oral   Take 75 mg by mouth 2 (two) times daily. For 10 days         . PRENATAL MULTIVITAMIN CH   Oral   Take 1 tablet by mouth daily.         Marland Kitchen  AMOXICILLIN 500 MG PO CAPS   Oral   Take 1 capsule (500 mg total) by mouth 3 (three) times daily.   21 capsule   0   . PREDNISONE 10 MG PO TABS   Oral   Take 2 tablets (20 mg total) by mouth daily.   6 tablet   0     BP 126/73  Pulse 144  Temp 97.8 F (36.6 C) (Oral)  Resp 22  Ht 5\' 9"  (1.753 m)  Wt 220 lb (99.791 kg)  BMI 32.49 kg/m2  SpO2 97%  LMP 12/15/2011  Physical Exam  Constitutional: She is oriented to person, place, and time. She appears well-developed.  HENT:  Head: Normocephalic and atraumatic.  Eyes: Conjunctivae normal and EOM are normal. No scleral icterus.  Neck: Neck supple. No thyromegaly present.  Cardiovascular: Normal rate and regular rhythm.  Exam reveals no gallop and no friction rub.   No murmur heard. Pulmonary/Chest: No stridor. She has wheezes. She has no rales. She exhibits no tenderness.  Abdominal: She exhibits no  distension. There is no tenderness. There is no rebound.  Musculoskeletal: Normal range of motion. She exhibits no edema.  Lymphadenopathy:    She has no cervical adenopathy.  Neurological: She is oriented to person, place, and time. Coordination normal.  Skin: No rash noted. No erythema.  Psychiatric: She has a normal mood and affect. Her behavior is normal.    ED Course  Procedures (including critical care time)  Labs Reviewed - No data to display No results found.   1. Bronchospasm      Pt improved with tx MDM          Benny Lennert, MD 04/04/12 2137

## 2012-04-04 NOTE — ED Notes (Signed)
Pt alert & oriented x4, stable gait. Patient given discharge instructions, paperwork & prescription(s). Patient  instructed to stop at the registration desk to finish any additional paperwork. Patient verbalized understanding. Pt left department w/ no further questions. 

## 2012-04-04 NOTE — ED Notes (Signed)
Pt went to pcp today and they are treating her for the flu and is being treated with tamiflu. Pt states her sob has gotten worse and the breathing treatments and inhaler is not working. Pt is [redacted] weeks pregnant.

## 2012-05-09 ENCOUNTER — Encounter: Payer: Self-pay | Admitting: *Deleted

## 2012-05-09 DIAGNOSIS — J45909 Unspecified asthma, uncomplicated: Secondary | ICD-10-CM | POA: Insufficient documentation

## 2012-05-09 DIAGNOSIS — G809 Cerebral palsy, unspecified: Secondary | ICD-10-CM | POA: Insufficient documentation

## 2012-05-09 DIAGNOSIS — E039 Hypothyroidism, unspecified: Secondary | ICD-10-CM | POA: Insufficient documentation

## 2012-05-29 ENCOUNTER — Ambulatory Visit (INDEPENDENT_AMBULATORY_CARE_PROVIDER_SITE_OTHER): Payer: Medicaid Other | Admitting: Obstetrics & Gynecology

## 2012-05-29 VITALS — BP 130/82 | Wt 238.0 lb

## 2012-05-29 DIAGNOSIS — O99280 Endocrine, nutritional and metabolic diseases complicating pregnancy, unspecified trimester: Secondary | ICD-10-CM

## 2012-05-29 DIAGNOSIS — O09529 Supervision of elderly multigravida, unspecified trimester: Secondary | ICD-10-CM

## 2012-05-29 DIAGNOSIS — O09299 Supervision of pregnancy with other poor reproductive or obstetric history, unspecified trimester: Secondary | ICD-10-CM

## 2012-05-29 DIAGNOSIS — Z1389 Encounter for screening for other disorder: Secondary | ICD-10-CM

## 2012-05-29 DIAGNOSIS — O34219 Maternal care for unspecified type scar from previous cesarean delivery: Secondary | ICD-10-CM

## 2012-05-29 DIAGNOSIS — Z3482 Encounter for supervision of other normal pregnancy, second trimester: Secondary | ICD-10-CM

## 2012-05-29 DIAGNOSIS — Z331 Pregnant state, incidental: Secondary | ICD-10-CM

## 2012-05-29 LAB — POCT URINALYSIS DIPSTICK
Ketones, UA: NEGATIVE
Protein, UA: NEGATIVE

## 2012-05-29 NOTE — Progress Notes (Signed)
Patient reports good fetal movement, denies any bleeding and no rupture of membranes symptoms or contraction No complaints.  Discussed diet issues, carb restriction, recommendations.  All questions answered.

## 2012-05-29 NOTE — Patient Instructions (Addendum)
Remember:  Sugar test, nothing to eat or drink after midnight. Pregnancy - Second Trimester The second trimester of pregnancy (3 to 6 months) is a period of rapid growth for you and your baby. At the end of the sixth month, your baby is about 9 inches long and weighs 1 1/2 pounds. You will begin to feel the baby move between 18 and 20 weeks of the pregnancy. This is called quickening. Weight gain is faster. A clear fluid (colostrum) may leak out of your breasts. You may feel small contractions of the womb (uterus). This is known as false labor or Braxton-Hicks contractions. This is like a practice for labor when the baby is ready to be born. Usually, the problems with morning sickness have usually passed by the end of your first trimester. Some women develop small dark blotches (called cholasma, mask of pregnancy) on their face that usually goes away after the baby is born. Exposure to the sun makes the blotches worse. Acne may also develop in some pregnant women and pregnant women who have acne, may find that it goes away. PRENATAL EXAMS  Blood work may continue to be done during prenatal exams. These tests are done to check on your health and the probable health of your baby. Blood work is used to follow your blood levels (hemoglobin). Anemia (low hemoglobin) is common during pregnancy. Iron and vitamins are given to help prevent this. You will also be checked for diabetes between 24 and 28 weeks of the pregnancy. Some of the previous blood tests may be repeated.  The size of the uterus is measured during each visit. This is to make sure that the baby is continuing to grow properly according to the dates of the pregnancy.  Your blood pressure is checked every prenatal visit. This is to make sure you are not getting toxemia.  Your urine is checked to make sure you do not have an infection, diabetes or protein in the urine.  Your weight is checked often to make sure gains are happening at the suggested  rate. This is to ensure that both you and your baby are growing normally.  Sometimes, an ultrasound is performed to confirm the proper growth and development of the baby. This is a test which bounces harmless sound waves off the baby so your caregiver can more accurately determine due dates. Sometimes, a specialized test is done on the amniotic fluid surrounding the baby. This test is called an amniocentesis. The amniotic fluid is obtained by sticking a needle into the belly (abdomen). This is done to check the chromosomes in instances where there is a concern about possible genetic problems with the baby. It is also sometimes done near the end of pregnancy if an early delivery is required. In this case, it is done to help make sure the baby's lungs are mature enough for the baby to live outside of the womb. CHANGES OCCURING IN THE SECOND TRIMESTER OF PREGNANCY Your body goes through many changes during pregnancy. They vary from person to person. Talk to your caregiver about changes you notice that you are concerned about.  During the second trimester, you will likely have an increase in your appetite. It is normal to have cravings for certain foods. This varies from person to person and pregnancy to pregnancy.  Your lower abdomen will begin to bulge.  You may have to urinate more often because the uterus and baby are pressing on your bladder. It is also common to get more bladder  infections during pregnancy (pain with urination). You can help this by drinking lots of fluids and emptying your bladder before and after intercourse.  You may begin to get stretch marks on your hips, abdomen, and breasts. These are normal changes in the body during pregnancy. There are no exercises or medications to take that prevent this change.  You may begin to develop swollen and bulging veins (varicose veins) in your legs. Wearing support hose, elevating your feet for 15 minutes, 3 to 4 times a day and limiting salt  in your diet helps lessen the problem.  Heartburn may develop as the uterus grows and pushes up against the stomach. Antacids recommended by your caregiver helps with this problem. Also, eating smaller meals 4 to 5 times a day helps.  Constipation can be treated with a stool softener or adding bulk to your diet. Drinking lots of fluids, vegetables, fruits, and whole grains are helpful.  Exercising is also helpful. If you have been very active up until your pregnancy, most of these activities can be continued during your pregnancy. If you have been less active, it is helpful to start an exercise program such as walking.  Hemorrhoids (varicose veins in the rectum) may develop at the end of the second trimester. Warm sitz baths and hemorrhoid cream recommended by your caregiver helps hemorrhoid problems.  Backaches may develop during this time of your pregnancy. Avoid heavy lifting, wear low heal shoes and practice good posture to help with backache problems.  Some pregnant women develop tingling and numbness of their hand and fingers because of swelling and tightening of ligaments in the wrist (carpel tunnel syndrome). This goes away after the baby is born.  As your breasts enlarge, you may have to get a bigger bra. Get a comfortable, cotton, support bra. Do not get a nursing bra until the last month of the pregnancy if you will be nursing the baby.  You may get a dark line from your belly button to the pubic area called the linea nigra.  You may develop rosy cheeks because of increase blood flow to the face.  You may develop spider looking lines of the face, neck, arms and chest. These go away after the baby is born. HOME CARE INSTRUCTIONS   It is extremely important to avoid all smoking, herbs, alcohol, and unprescribed drugs during your pregnancy. These chemicals affect the formation and growth of the baby. Avoid these chemicals throughout the pregnancy to ensure the delivery of a healthy  infant.  Most of your home care instructions are the same as suggested for the first trimester of your pregnancy. Keep your caregiver's appointments. Follow your caregiver's instructions regarding medication use, exercise and diet.  During pregnancy, you are providing food for you and your baby. Continue to eat regular, well-balanced meals. Choose foods such as meat, fish, milk and other low fat dairy products, vegetables, fruits, and whole-grain breads and cereals. Your caregiver will tell you of the ideal weight gain.  A physical sexual relationship may be continued up until near the end of pregnancy if there are no other problems. Problems could include early (premature) leaking of amniotic fluid from the membranes, vaginal bleeding, abdominal pain, or other medical or pregnancy problems.  Exercise regularly if there are no restrictions. Check with your caregiver if you are unsure of the safety of some of your exercises. The greatest weight gain will occur in the last 2 trimesters of pregnancy. Exercise will help you:  Control your weight.  Get you in shape for labor and delivery.  Lose weight after you have the baby.  Wear a good support or jogging bra for breast tenderness during pregnancy. This may help if worn during sleep. Pads or tissues may be used in the bra if you are leaking colostrum.  Do not use hot tubs, steam rooms or saunas throughout the pregnancy.  Wear your seat belt at all times when driving. This protects you and your baby if you are in an accident.  Avoid raw meat, uncooked cheese, cat litter boxes and soil used by cats. These carry germs that can cause birth defects in the baby.  The second trimester is also a good time to visit your dentist for your dental health if this has not been done yet. Getting your teeth cleaned is OK. Use a soft toothbrush. Brush gently during pregnancy.  It is easier to loose urine during pregnancy. Tightening up and strengthening the  pelvic muscles will help with this problem. Practice stopping your urination while you are going to the bathroom. These are the same muscles you need to strengthen. It is also the muscles you would use as if you were trying to stop from passing gas. You can practice tightening these muscles up 10 times a set and repeating this about 3 times per day. Once you know what muscles to tighten up, do not perform these exercises during urination. It is more likely to contribute to an infection by backing up the urine.  Ask for help if you have financial, counseling or nutritional needs during pregnancy. Your caregiver will be able to offer counseling for these needs as well as refer you for other special needs.  Your skin may become oily. If so, wash your face with mild soap, use non-greasy moisturizer and oil or cream based makeup. MEDICATIONS AND DRUG USE IN PREGNANCY  Take prenatal vitamins as directed. The vitamin should contain 1 milligram of folic acid. Keep all vitamins out of reach of children. Only a couple vitamins or tablets containing iron may be fatal to a baby or young child when ingested.  Avoid use of all medications, including herbs, over-the-counter medications, not prescribed or suggested by your caregiver. Only take over-the-counter or prescription medicines for pain, discomfort, or fever as directed by your caregiver. Do not use aspirin.  Let your caregiver also know about herbs you may be using.  Alcohol is related to a number of birth defects. This includes fetal alcohol syndrome. All alcohol, in any form, should be avoided completely. Smoking will cause low birth rate and premature babies.  Street or illegal drugs are very harmful to the baby. They are absolutely forbidden. A baby born to an addicted mother will be addicted at birth. The baby will go through the same withdrawal an adult does. SEEK MEDICAL CARE IF:  You have any concerns or worries during your pregnancy. It is better  to call with your questions if you feel they cannot wait, rather than worry about them. SEEK IMMEDIATE MEDICAL CARE IF:   An unexplained oral temperature above 102 F (38.9 C) develops, or as your caregiver suggests.  You have leaking of fluid from the vagina (birth canal). If leaking membranes are suspected, take your temperature and tell your caregiver of this when you call.  There is vaginal spotting, bleeding, or passing clots. Tell your caregiver of the amount and how many pads are used. Light spotting in pregnancy is common, especially following intercourse.  You develop a bad  smelling vaginal discharge with a change in the color from clear to white.  You continue to feel sick to your stomach (nauseated) and have no relief from remedies suggested. You vomit blood or coffee ground-like materials.  You lose more than 2 pounds of weight or gain more than 2 pounds of weight over 1 week, or as suggested by your caregiver.  You notice swelling of your face, hands, feet, or legs.  You get exposed to [place] measles and have never had them.  You are exposed to fifth disease or chickenpox.  You develop belly (abdominal) pain. Round ligament discomfort is a common non-cancerous (benign) cause of abdominal pain in pregnancy. Your caregiver still must evaluate you.  You develop a bad headache that does not go away.  You develop fever, diarrhea, pain with urination, or shortness of breath.  You develop visual problems, blurry, or double vision.  You fall or are in a car accident or any kind of trauma.  There is mental or physical violence at home. Document Released: 02/09/2001 Document Revised: 05/10/2011 Document Reviewed: 08/14/2008 [facility] Patient Information 2013 [place], [place].

## 2012-06-12 ENCOUNTER — Ambulatory Visit (INDEPENDENT_AMBULATORY_CARE_PROVIDER_SITE_OTHER): Payer: Medicaid Other | Admitting: Women's Health

## 2012-06-12 ENCOUNTER — Encounter: Payer: Self-pay | Admitting: Women's Health

## 2012-06-12 VITALS — BP 120/60 | Wt 239.2 lb

## 2012-06-12 DIAGNOSIS — Z1389 Encounter for screening for other disorder: Secondary | ICD-10-CM

## 2012-06-12 DIAGNOSIS — B009 Herpesviral infection, unspecified: Secondary | ICD-10-CM | POA: Insufficient documentation

## 2012-06-12 DIAGNOSIS — O09299 Supervision of pregnancy with other poor reproductive or obstetric history, unspecified trimester: Secondary | ICD-10-CM

## 2012-06-12 DIAGNOSIS — O09529 Supervision of elderly multigravida, unspecified trimester: Secondary | ICD-10-CM

## 2012-06-12 DIAGNOSIS — O34219 Maternal care for unspecified type scar from previous cesarean delivery: Secondary | ICD-10-CM

## 2012-06-12 DIAGNOSIS — Z98891 History of uterine scar from previous surgery: Secondary | ICD-10-CM

## 2012-06-12 DIAGNOSIS — E039 Hypothyroidism, unspecified: Secondary | ICD-10-CM

## 2012-06-12 DIAGNOSIS — Z3482 Encounter for supervision of other normal pregnancy, second trimester: Secondary | ICD-10-CM

## 2012-06-12 DIAGNOSIS — E079 Disorder of thyroid, unspecified: Secondary | ICD-10-CM

## 2012-06-12 DIAGNOSIS — Z331 Pregnant state, incidental: Secondary | ICD-10-CM

## 2012-06-12 LAB — POCT URINALYSIS DIPSTICK
Blood, UA: NEGATIVE
Ketones, UA: NEGATIVE
Protein, UA: NEGATIVE

## 2012-06-12 NOTE — Progress Notes (Signed)
Was sent here by Dr. Sudie Bailey for swelling in wrist, legs, and feet.

## 2012-06-12 NOTE — Patient Instructions (Addendum)
You will have your sugar test next visit. Nothing to eat or drink after midnight the night before you come and you will be here for at least 2 hours.   Pregnancy - Second Trimester The second trimester of pregnancy (3 to 6 months) is a period of rapid growth for you and your baby. At the end of the sixth month, your baby is about 9 inches long and weighs 1 1/2 pounds. You will begin to feel the baby move between 18 and 20 weeks of the pregnancy. This is called quickening. Weight gain is faster. A clear fluid (colostrum) may leak out of your breasts. You may feel small contractions of the womb (uterus). This is known as false labor or Braxton-Hicks contractions. This is like a practice for labor when the baby is ready to be born. Usually, the problems with morning sickness have usually passed by the end of your first trimester. Some women develop small dark blotches (called cholasma, mask of pregnancy) on their face that usually goes away after the baby is born. Exposure to the sun makes the blotches worse. Acne may also develop in some pregnant women and pregnant women who have acne, may find that it goes away. PRENATAL EXAMS  Blood work may continue to be done during prenatal exams. These tests are done to check on your health and the probable health of your baby. Blood work is used to follow your blood levels (hemoglobin). Anemia (low hemoglobin) is common during pregnancy. Iron and vitamins are given to help prevent this. You will also be checked for diabetes between 24 and 28 weeks of the pregnancy. Some of the previous blood tests may be repeated.  The size of the uterus is measured during each visit. This is to make sure that the baby is continuing to grow properly according to the dates of the pregnancy.  Your blood pressure is checked every prenatal visit. This is to make sure you are not getting toxemia.  Your urine is checked to make sure you do not have an infection, diabetes or protein in the  urine.  Your weight is checked often to make sure gains are happening at the suggested rate. This is to ensure that both you and your baby are growing normally.  Sometimes, an ultrasound is performed to confirm the proper growth and development of the baby. This is a test which bounces harmless sound waves off the baby so your caregiver can more accurately determine due dates. Sometimes, a specialized test is done on the amniotic fluid surrounding the baby. This test is called an amniocentesis. The amniotic fluid is obtained by sticking a needle into the belly (abdomen). This is done to check the chromosomes in instances where there is a concern about possible genetic problems with the baby. It is also sometimes done near the end of pregnancy if an early delivery is required. In this case, it is done to help make sure the baby's lungs are mature enough for the baby to live outside of the womb. CHANGES OCCURING IN THE SECOND TRIMESTER OF PREGNANCY Your body goes through many changes during pregnancy. They vary from person to person. Talk to your caregiver about changes you notice that you are concerned about.  During the second trimester, you will likely have an increase in your appetite. It is normal to have cravings for certain foods. This varies from person to person and pregnancy to pregnancy.  Your lower abdomen will begin to bulge.  You may have to  urinate more often because the uterus and baby are pressing on your bladder. It is also common to get more bladder infections during pregnancy (pain with urination). You can help this by drinking lots of fluids and emptying your bladder before and after intercourse.  You may begin to get stretch marks on your hips, abdomen, and breasts. These are normal changes in the body during pregnancy. There are no exercises or medications to take that prevent this change.  You may begin to develop swollen and bulging veins (varicose veins) in your legs. Wearing  support hose, elevating your feet for 15 minutes, 3 to 4 times a day and limiting salt in your diet helps lessen the problem.  Heartburn may develop as the uterus grows and pushes up against the stomach. Antacids recommended by your caregiver helps with this problem. Also, eating smaller meals 4 to 5 times a day helps.  Constipation can be treated with a stool softener or adding bulk to your diet. Drinking lots of fluids, vegetables, fruits, and whole grains are helpful.  Exercising is also helpful. If you have been very active up until your pregnancy, most of these activities can be continued during your pregnancy. If you have been less active, it is helpful to start an exercise program such as walking.  Hemorrhoids (varicose veins in the rectum) may develop at the end of the second trimester. Warm sitz baths and hemorrhoid cream recommended by your caregiver helps hemorrhoid problems.  Backaches may develop during this time of your pregnancy. Avoid heavy lifting, wear low heal shoes and practice good posture to help with backache problems.  Some pregnant women develop tingling and numbness of their hand and fingers because of swelling and tightening of ligaments in the wrist (carpel tunnel syndrome). This goes away after the baby is born.  As your breasts enlarge, you may have to get a bigger bra. Get a comfortable, cotton, support bra. Do not get a nursing bra until the last month of the pregnancy if you will be nursing the baby.  You may get a dark line from your belly button to the pubic area called the linea nigra.  You may develop rosy cheeks because of increase blood flow to the face.  You may develop spider looking lines of the face, neck, arms and chest. These go away after the baby is born. HOME CARE INSTRUCTIONS   It is extremely important to avoid all smoking, herbs, alcohol, and unprescribed drugs during your pregnancy. These chemicals affect the formation and growth of the baby.  Avoid these chemicals throughout the pregnancy to ensure the delivery of a healthy infant.  Most of your home care instructions are the same as suggested for the first trimester of your pregnancy. Keep your caregiver's appointments. Follow your caregiver's instructions regarding medication use, exercise and diet.  During pregnancy, you are providing food for you and your baby. Continue to eat regular, well-balanced meals. Choose foods such as meat, fish, milk and other low fat dairy products, vegetables, fruits, and whole-grain breads and cereals. Your caregiver will tell you of the ideal weight gain.  A physical sexual relationship may be continued up until near the end of pregnancy if there are no other problems. Problems could include early (premature) leaking of amniotic fluid from the membranes, vaginal bleeding, abdominal pain, or other medical or pregnancy problems.  Exercise regularly if there are no restrictions. Check with your caregiver if you are unsure of the safety of some of your exercises. The  greatest weight gain will occur in the last 2 trimesters of pregnancy. Exercise will help you:  Control your weight.  Get you in shape for labor and delivery.  Lose weight after you have the baby.  Wear a good support or jogging bra for breast tenderness during pregnancy. This may help if worn during sleep. Pads or tissues may be used in the bra if you are leaking colostrum.  Do not use hot tubs, steam rooms or saunas throughout the pregnancy.  Wear your seat belt at all times when driving. This protects you and your baby if you are in an accident.  Avoid raw meat, uncooked cheese, cat litter boxes and soil used by cats. These carry germs that can cause birth defects in the baby.  The second trimester is also a good time to visit your dentist for your dental health if this has not been done yet. Getting your teeth cleaned is OK. Use a soft toothbrush. Brush gently during pregnancy.  It  is easier to loose urine during pregnancy. Tightening up and strengthening the pelvic muscles will help with this problem. Practice stopping your urination while you are going to the bathroom. These are the same muscles you need to strengthen. It is also the muscles you would use as if you were trying to stop from passing gas. You can practice tightening these muscles up 10 times a set and repeating this about 3 times per day. Once you know what muscles to tighten up, do not perform these exercises during urination. It is more likely to contribute to an infection by backing up the urine.  Ask for help if you have financial, counseling or nutritional needs during pregnancy. Your caregiver will be able to offer counseling for these needs as well as refer you for other special needs.  Your skin may become oily. If so, wash your face with mild soap, use non-greasy moisturizer and oil or cream based makeup. MEDICATIONS AND DRUG USE IN PREGNANCY  Take prenatal vitamins as directed. The vitamin should contain 1 milligram of folic acid. Keep all vitamins out of reach of children. Only a couple vitamins or tablets containing iron may be fatal to a baby or young child when ingested.  Avoid use of all medications, including herbs, over-the-counter medications, not prescribed or suggested by your caregiver. Only take over-the-counter or prescription medicines for pain, discomfort, or fever as directed by your caregiver. Do not use aspirin.  Let your caregiver also know about herbs you may be using.  Alcohol is related to a number of birth defects. This includes fetal alcohol syndrome. All alcohol, in any form, should be avoided completely. Smoking will cause low birth rate and premature babies.  Street or illegal drugs are very harmful to the baby. They are absolutely forbidden. A baby born to an addicted mother will be addicted at birth. The baby will go through the same withdrawal an adult does. SEEK MEDICAL  CARE IF:  You have any concerns or worries during your pregnancy. It is better to call with your questions if you feel they cannot wait, rather than worry about them. SEEK IMMEDIATE MEDICAL CARE IF:   An unexplained oral temperature above 102 F (38.9 C) develops, or as your caregiver suggests.  You have leaking of fluid from the vagina (birth canal). If leaking membranes are suspected, take your temperature and tell your caregiver of this when you call.  There is vaginal spotting, bleeding, or passing clots. Tell your caregiver of the  amount and how many pads are used. Light spotting in pregnancy is common, especially following intercourse.  You develop a bad smelling vaginal discharge with a change in the color from clear to white.  You continue to feel sick to your stomach (nauseated) and have no relief from remedies suggested. You vomit blood or coffee ground-like materials.  You lose more than 2 pounds of weight or gain more than 2 pounds of weight over 1 week, or as suggested by your caregiver.  You notice swelling of your face, hands, feet, or legs.  You get exposed to [place] measles and have never had them.  You are exposed to fifth disease or chickenpox.  You develop belly (abdominal) pain. Round ligament discomfort is a common non-cancerous (benign) cause of abdominal pain in pregnancy. Your caregiver still must evaluate you.  You develop a bad headache that does not go away.  You develop fever, diarrhea, pain with urination, or shortness of breath.  You develop visual problems, blurry, or double vision.  You fall or are in a car accident or any kind of trauma.  There is mental or physical violence at home. Document Released: 02/09/2001 Document Revised: 05/10/2011 Document Reviewed: 08/14/2008 [facility] Patient Information 2013 [place], [place].

## 2012-06-12 NOTE — Progress Notes (Signed)
Saw PCP today for routine exam and med f/u, states he was concerned about her swelling. Trace edema in bilateral upper and lower extremities. Denies scotomata, ruq/epigastric pain, n/v. Has occasional mild headaches 'nothing new'. Reports good fm. Denies uc's, lof, vb, urinary frequency, urgency, hesitancy, or dysuria.  No other complaints.  Recommended propping legs up occasionally if needed to help w/ edema. All questions answered.  Discussed overall weight gain- diet, decreasing carbs and sugary drinks, and recommended drinking more water. Reminded about PN2 next visit.  Will also schedule f/u anatomy u/s d/t suboptimal spine view, and TSH w/ PN2.

## 2012-06-13 ENCOUNTER — Other Ambulatory Visit: Payer: Self-pay | Admitting: Obstetrics & Gynecology

## 2012-06-13 DIAGNOSIS — O358XX0 Maternal care for other (suspected) fetal abnormality and damage, not applicable or unspecified: Secondary | ICD-10-CM

## 2012-06-26 ENCOUNTER — Other Ambulatory Visit: Payer: Medicaid Other

## 2012-06-27 ENCOUNTER — Other Ambulatory Visit: Payer: Medicaid Other

## 2012-06-27 ENCOUNTER — Ambulatory Visit (INDEPENDENT_AMBULATORY_CARE_PROVIDER_SITE_OTHER): Payer: Medicaid Other | Admitting: Obstetrics & Gynecology

## 2012-06-27 ENCOUNTER — Ambulatory Visit (INDEPENDENT_AMBULATORY_CARE_PROVIDER_SITE_OTHER): Payer: Medicaid Other

## 2012-06-27 ENCOUNTER — Encounter: Payer: Self-pay | Admitting: Advanced Practice Midwife

## 2012-06-27 VITALS — BP 128/78 | Wt 241.0 lb

## 2012-06-27 DIAGNOSIS — O09299 Supervision of pregnancy with other poor reproductive or obstetric history, unspecified trimester: Secondary | ICD-10-CM

## 2012-06-27 DIAGNOSIS — Z98891 History of uterine scar from previous surgery: Secondary | ICD-10-CM

## 2012-06-27 DIAGNOSIS — Z1389 Encounter for screening for other disorder: Secondary | ICD-10-CM

## 2012-06-27 DIAGNOSIS — Z331 Pregnant state, incidental: Secondary | ICD-10-CM

## 2012-06-27 DIAGNOSIS — O99340 Other mental disorders complicating pregnancy, unspecified trimester: Secondary | ICD-10-CM

## 2012-06-27 DIAGNOSIS — O358XX0 Maternal care for other (suspected) fetal abnormality and damage, not applicable or unspecified: Secondary | ICD-10-CM

## 2012-06-27 DIAGNOSIS — Z3480 Encounter for supervision of other normal pregnancy, unspecified trimester: Secondary | ICD-10-CM

## 2012-06-27 DIAGNOSIS — O34219 Maternal care for unspecified type scar from previous cesarean delivery: Secondary | ICD-10-CM

## 2012-06-27 DIAGNOSIS — O09529 Supervision of elderly multigravida, unspecified trimester: Secondary | ICD-10-CM

## 2012-06-27 DIAGNOSIS — Z3482 Encounter for supervision of other normal pregnancy, second trimester: Secondary | ICD-10-CM

## 2012-06-27 LAB — POCT URINALYSIS DIPSTICK
Blood, UA: NEGATIVE
Glucose, UA: NEGATIVE
Nitrite, UA: NEGATIVE

## 2012-06-27 LAB — CBC
MCV: 85.3 fL (ref 78.0–100.0)
Platelets: 269 10*3/uL (ref 150–400)
RDW: 14.1 % (ref 11.5–15.5)
WBC: 11.2 10*3/uL — ABNORMAL HIGH (ref 4.0–10.5)

## 2012-06-27 NOTE — Progress Notes (Signed)
FOB C/O pt having no sex drive since becoming pregnant.  Has no love of  Music or any of the things she used to find enjoyable.  Was on Zoloft 200mg  qd for depression, but stopped it when became pregnant.  Pt will restart at 50mg .  BTL papers signed today.

## 2012-06-27 NOTE — Progress Notes (Signed)
U/S (28+5wks)- active vtx fetus approp growth EFW 2 lb 15 oz (1326gms 57th%tile), fluid wnl, ant gr 1 plac, female fetus "[name]"

## 2012-06-28 ENCOUNTER — Other Ambulatory Visit: Payer: Self-pay | Admitting: Obstetrics & Gynecology

## 2012-06-28 ENCOUNTER — Telehealth: Payer: Self-pay | Admitting: Advanced Practice Midwife

## 2012-06-28 ENCOUNTER — Other Ambulatory Visit: Payer: Self-pay | Admitting: Advanced Practice Midwife

## 2012-06-28 DIAGNOSIS — E119 Type 2 diabetes mellitus without complications: Secondary | ICD-10-CM

## 2012-06-28 DIAGNOSIS — O0990 Supervision of high risk pregnancy, unspecified, unspecified trimester: Secondary | ICD-10-CM | POA: Insufficient documentation

## 2012-06-28 DIAGNOSIS — O0993 Supervision of high risk pregnancy, unspecified, third trimester: Secondary | ICD-10-CM

## 2012-06-28 DIAGNOSIS — Z3482 Encounter for supervision of other normal pregnancy, second trimester: Secondary | ICD-10-CM

## 2012-06-28 DIAGNOSIS — O24913 Unspecified diabetes mellitus in pregnancy, third trimester: Secondary | ICD-10-CM

## 2012-06-28 DIAGNOSIS — Z98891 History of uterine scar from previous surgery: Secondary | ICD-10-CM

## 2012-06-28 DIAGNOSIS — O24419 Gestational diabetes mellitus in pregnancy, unspecified control: Secondary | ICD-10-CM | POA: Insufficient documentation

## 2012-06-28 LAB — HIV ANTIBODY (ROUTINE TESTING W REFLEX): HIV: NONREACTIVE

## 2012-06-28 LAB — RPR

## 2012-06-28 LAB — GLUCOSE TOLERANCE, 2 HOURS W/ 1HR: Glucose, Fasting: 87 mg/dL (ref 70–99)

## 2012-06-28 LAB — ANTIBODY SCREEN: Antibody Screen: NEGATIVE

## 2012-06-28 MED ORDER — SERTRALINE HCL 50 MG PO TABS: 50.0000 mg | ORAL_TABLET | Freq: Every day | ORAL | Status: DC

## 2012-06-28 NOTE — Telephone Encounter (Signed)
Pt called and informed of failed 2hr gtt.  Glucometer called in and dietician referral made.  F/U 2 weeks

## 2012-06-29 ENCOUNTER — Telehealth: Payer: Self-pay | Admitting: Advanced Practice Midwife

## 2012-06-29 NOTE — Telephone Encounter (Signed)
Routed to Jennifer. JSY 

## 2012-06-29 NOTE — Telephone Encounter (Signed)
Pt called back and said no meter had been called in. Spoke with [employer]. Called  Glucometer, strips, and lancets to [facility]. Pt to check sugar QID. Pt notified.

## 2012-06-29 NOTE — Telephone Encounter (Signed)
I looked back at Fran's note. Glucose monitor was called in to [facility]. Pt checked with [facility]. Advised to call [facility].

## 2012-07-03 LAB — US OB FOLLOW UP

## 2012-07-13 ENCOUNTER — Encounter: Payer: Self-pay | Admitting: Advanced Practice Midwife

## 2012-07-13 ENCOUNTER — Telehealth: Payer: Self-pay | Admitting: Obstetrics and Gynecology

## 2012-07-13 ENCOUNTER — Ambulatory Visit (INDEPENDENT_AMBULATORY_CARE_PROVIDER_SITE_OTHER): Payer: Medicaid Other | Admitting: Obstetrics & Gynecology

## 2012-07-13 VITALS — BP 98/80 | Wt 240.0 lb

## 2012-07-13 DIAGNOSIS — O34219 Maternal care for unspecified type scar from previous cesarean delivery: Secondary | ICD-10-CM

## 2012-07-13 DIAGNOSIS — O24913 Unspecified diabetes mellitus in pregnancy, third trimester: Secondary | ICD-10-CM

## 2012-07-13 DIAGNOSIS — O99340 Other mental disorders complicating pregnancy, unspecified trimester: Secondary | ICD-10-CM

## 2012-07-13 DIAGNOSIS — O09299 Supervision of pregnancy with other poor reproductive or obstetric history, unspecified trimester: Secondary | ICD-10-CM

## 2012-07-13 DIAGNOSIS — O24419 Gestational diabetes mellitus in pregnancy, unspecified control: Secondary | ICD-10-CM

## 2012-07-13 DIAGNOSIS — O09529 Supervision of elderly multigravida, unspecified trimester: Secondary | ICD-10-CM

## 2012-07-13 DIAGNOSIS — O99810 Abnormal glucose complicating pregnancy: Secondary | ICD-10-CM

## 2012-07-13 DIAGNOSIS — Z1389 Encounter for screening for other disorder: Secondary | ICD-10-CM

## 2012-07-13 DIAGNOSIS — Z331 Pregnant state, incidental: Secondary | ICD-10-CM

## 2012-07-13 LAB — POCT URINALYSIS DIPSTICK
Glucose, UA: NEGATIVE
Leukocytes, UA: NEGATIVE
Nitrite, UA: NEGATIVE
Protein, UA: NEGATIVE

## 2012-07-13 NOTE — Progress Notes (Signed)
All FBS 91-115.  2hr PP are usually < 120 with occ 150-190.  Will start Glyburide 5 mg qhs. Has not heard from dietician.  Will resend request and call for the pt. Pt aware that she is now an A2DM, so will begin 2x/week NST next week.   Started Zoloft 2 weeks ago. "no better, no worse".  F/U 1 week for LROB/NST

## 2012-07-13 NOTE — Telephone Encounter (Signed)
Worried about Gestational diabetes and still born deliveries. Pt stated [name] was going to call in something for her to take in the morning for her Diabetes.

## 2012-07-13 NOTE — Assessment & Plan Note (Signed)
A2 (started glyburide 5mg  qhs 5/15).  2X/week NST @ 32 weeks

## 2012-07-13 NOTE — Patient Instructions (Signed)
Diabetes Meal Planning Guide The diabetes meal planning guide is a tool to help you plan your meals and snacks. It is important for people with diabetes to manage their blood glucose (sugar) levels. Choosing the right foods and the right amounts throughout your day will help control your blood glucose. Eating right can even help you improve your blood pressure and reach or maintain a healthy weight. CARBOHYDRATE COUNTING MADE EASY When you eat carbohydrates, they turn to sugar. This raises your blood glucose level. Counting carbohydrates can help you control this level so you feel better. When you plan your meals by counting carbohydrates, you can have more flexibility in what you eat and balance your medicine with your food intake. Carbohydrate counting simply means adding up the total amount of carbohydrate grams in your meals and snacks. Try to eat about the same amount at each meal. Foods with carbohydrates are listed below. Each portion below is 1 carbohydrate serving or 15 grams of carbohydrates. Ask your dietician how many grams of carbohydrates you should eat at each meal or snack. Grains and Starches  1 slice bread.   English muffin or hotdog/hamburger bun.   cup cold cereal (unsweetened).   cup cooked pasta or rice.   cup starchy vegetables (corn, potatoes, peas, beans, winter squash).  1 tortilla (6 inches).   bagel.  1 waffle or pancake (size of a CD).   cup cooked cereal.  4 to 6 small crackers. *Whole grain is recommended. Fruit  1 cup fresh unsweetened berries, melon, papaya, pineapple.  1 small fresh fruit.   banana or mango.   cup fruit juice (4 oz unsweetened).   cup canned fruit in natural juice or water.  2 tbs dried fruit.  12 to 15 grapes or cherries. Milk and Yogurt  1 cup fat-free or 1% milk.  1 cup soy milk.  6 oz light yogurt with sugar-free sweetener.  6 oz low-fat soy yogurt.  6 oz plain yogurt. Vegetables  1 cup raw or  cup  cooked is counted as 0 carbohydrates or a "free" food.  If you eat 3 or more servings at 1 meal, count them as 1 carbohydrate serving. Other Carbohydrates   oz chips or pretzels.   cup ice cream or frozen yogurt.   cup sherbet or sorbet.  2 inch square cake, no frosting.  1 tbs honey, sugar, jam, jelly, or syrup.  2 small cookies.  3 squares of graham crackers.  3 cups popcorn.  6 crackers.  1 cup broth-based soup.  Count 1 cup casserole or other mixed foods as 2 carbohydrate servings.  Foods with less than 20 calories in a serving may be counted as 0 carbohydrates or a "free" food. You may want to purchase a book or computer software that lists the carbohydrate gram counts of different foods. In addition, the nutrition facts panel on the labels of the foods you eat are a good source of this information. The label will tell you how big the serving size is and the total number of carbohydrate grams you will be eating per serving. Divide this number by 15 to obtain the number of carbohydrate servings in a portion. Remember, 1 carbohydrate serving equals 15 grams of carbohydrate. SERVING SIZES Measuring foods and serving sizes helps you make sure you are getting the right amount of food. The list below tells how big or small some common serving sizes are.  1 oz.........4 stacked dice.  3 oz.........Deck of cards.  1 tsp........Tip   of little finger.  1 tbs......Marland KitchenMarland KitchenThumb.  2 tbs.......Marland KitchenGolf ball.   cup......Marland KitchenHalf of a fist.  1 cup.......Marland KitchenA fist. SAMPLE DIABETES MEAL PLAN Below is a sample meal plan that includes foods from the grain and starches, dairy, vegetable, fruit, and meat groups. A dietician can individualize a meal plan to fit your calorie needs and tell you the number of servings needed from each food group. However, controlling the total amount of carbohydrates in your meal or snack is more important than making sure you include all of the food groups at every  meal. You may interchange carbohydrate containing foods (dairy, starches, and fruits). The meal plan below is an example of a 2000 calorie diet using carbohydrate counting. This meal plan has 17 carbohydrate servings. Breakfast  1 cup oatmeal (2 carb servings).   cup light yogurt (1 carb serving).  1 cup blueberries (1 carb serving).   cup almonds. Snack  1 large apple (2 carb servings).  1 low-fat string cheese stick. Lunch  Chicken breast salad.  1 cup spinach.   cup chopped tomatoes.  2 oz chicken breast, sliced.  2 tbs low-fat [place] dressing.  12 whole-wheat crackers (2 carb servings).  12 to 15 grapes (1 carb serving).  1 cup low-fat milk (1 carb serving). Snack  1 cup carrots.   cup hummus (1 carb serving). Dinner  3 oz broiled salmon.  1 cup brown rice (3 carb servings). Snack  1  cups steamed broccoli (1 carb serving) drizzled with 1 tsp olive oil and lemon juice.  1 cup light pudding (2 carb servings). DIABETES MEAL PLANNING WORKSHEET Your dietician can use this worksheet to help you decide how many servings of foods and what types of foods are right for you.  BREAKFAST Food Group and Servings / Carb Servings Grain/Starches __________________________________ Dairy __________________________________________ Vegetable ______________________________________ Fruit ___________________________________________ Meat __________________________________________ Fat ____________________________________________ LUNCH Food Group and Servings / Carb Servings Grain/Starches ___________________________________ Dairy ___________________________________________ Fruit ____________________________________________ Meat ___________________________________________ Fat _____________________________________________ Laural Golden Food Group and Servings / Carb Servings Grain/Starches ___________________________________ Dairy  ___________________________________________ Fruit ____________________________________________ Meat ___________________________________________ Fat _____________________________________________ SNACKS Food Group and Servings / Carb Servings Grain/Starches ___________________________________ Dairy ___________________________________________ Vegetable _______________________________________ Fruit ____________________________________________ Meat ___________________________________________ Fat _____________________________________________ DAILY TOTALS Starches _________________________ Vegetable ________________________ Fruit ____________________________ Dairy ____________________________ Meat ____________________________ Fat ______________________________ Document Released: 11/12/2004 Document Revised: 05/10/2011 Document Reviewed: 09/23/2008 [employer] Patient Information 2013 [place], [place]. Diets for Diabetes, Food Labeling Look at food labels to help you decide how much of a product you can eat. You will want to check the amount of total carbohydrate in a serving to see how the food fits into your meal plan. In the list of ingredients, the ingredient present in the largest amount by weight must be listed first, followed by the other ingredients in descending order. STANDARD OF IDENTITY Most products have a list of ingredients. However, foods that the Food and Drug Administration (FDA) has given a standard of identity do not need a list of ingredients. A standard of identity means that a food must contain certain ingredients if it is called a particular name. Examples are mayonnaise, peanut butter, ketchup, jelly, and cheese. LABELING TERMS There are many terms found on food labels. Some of these terms have specific definitions. Some terms are regulated by the FDA, and the FDA has clearly specified how they can be used. Others are not regulated or well-defined and can be misleading and  confusing. SPECIFICALLY DEFINED TERMS Nutritive Sweetener.  A sweetener that contains calories,such as table sugar or  honey. Nonnutritive Sweetener.  A sweetener with few or no calories,such as saccharin, aspartame, sucralose, and cyclamate. LABELING TERMS REGULATED BY THE FDA Free.  The product contains only a tiny or small amount of fat, cholesterol, sodium, sugar, or calories. For example, a "fat-free" product will contain less than 0.5 g of fat per serving. Low.  A food described as "low" in fat, saturated fat, cholesterol, sodium, or calories could be eaten fairly often without exceeding dietary guidelines. For example, "low in fat" means no more than 3 g of fat per serving. Lean.  "Lean" and "extra lean" are U.S. Department of Agriculture Scientist, research (physical sciences)) terms for use on meat and poultry products. "Lean" means the product contains less than 10 g of fat, 4 g of saturated fat, and 95 mg of cholesterol per serving. "Lean" is not as low in fat as a product labeled "low." Extra Lean.  "Extra lean" means the product contains less than 5 g of fat, 2 g of saturated fat, and 95 mg of cholesterol per serving. While "extra lean" has less fat than "lean," it is still higher in fat than a product labeled "low." Reduced, Less, Fewer.  A diet product that contains 25% less of a nutrient or calories than the regular version. For example, hot dogs might be labeled "25% less fat than our regular hot dogs." Light/Lite.  A diet product that contains  fewer calories or  the fat of the original. For example, "light in sodium" means a product with  the usual sodium. More.  One serving contains at least 10% more of the daily value of a vitamin, mineral, or fiber than usual. Good Source Of.  One serving contains 10% to 19% of the daily value for a particular vitamin, mineral, or fiber. Excellent Source Of.  One serving contains 20% or more of the daily value for a particular nutrient. Other terms used might  be "high in" or "rich in." Enriched or Fortified.  The product contains added vitamins, minerals, or protein. Nutrition labeling must be used on enriched or fortified foods. Imitation.  The product has been altered so that it is lower in protein, vitamins, or minerals than the usual food,such as imitation peanut butter. Total Fat.  The number listed is the total of all fat found in a serving of the product. Under total fat, food labels must list saturated fat and trans fat, which are associated with raising bad cholesterol and an increased risk of heart blood vessel disease. Saturated Fat.  Mainly fats from animal-based sources. Some examples are red meat, cheese, cream, whole milk, and coconut oil. Trans Fat.  Found in some fried snack foods, packaged foods, and fried restaurant foods. It is recommended you eat as close to 0 g of trans fat as possible, since it raises bad cholesterol and lowers good cholesterol. Polyunsaturated and Monounsaturated Fats.  More healthful fats. These fats are from plant sources. Total Carbohydrate.  The number of carbohydrate grams in a serving of the product. Under total carbohydrate are listed the other carbohydrate sources, such as dietary fiber and sugars. Dietary Fiber.  A carbohydrate from plant sources. Sugars.  Sugars listed on the label contain all naturally occurring sugars as well as added sugars. LABELING TERMS NOT REGULATED BY THE FDA Sugarless.  Table sugar (sucrose) has not been added. However, the manufacturer may use another form of sugar in place of sucrose to sweeten the product. For example, sugar alcohols are used to sweeten foods. Sugar alcohols are a form  of sugar but are not table sugar. If a product contains sugar alcohols in place of sucrose, it can still be labeled "sugarless." Low Salt, Salt-Free, Unsalted, No Salt, No Salt Added, Without Added Salt.  Food that is usually processed with salt has been made without salt.  However, the food may contain sodium-containing additives, such as preservatives, leavening agents, or flavorings. Natural.  This term has no legal meaning. Organic.  Foods that are certified as organic have been inspected and approved by the USDA to ensure they are produced without pesticides, fertilizers containing synthetic ingredients, bioengineering, or ionizing radiation. Document Released: 02/18/2003 Document Revised: 05/10/2011 Document Reviewed: 09/05/2008 [facility] Patient Information 2013 [place], [place].

## 2012-07-14 ENCOUNTER — Telehealth: Payer: Self-pay | Admitting: Obstetrics & Gynecology

## 2012-07-14 MED ORDER — GLYBURIDE 5 MG PO TABS: ORAL_TABLET | ORAL | Status: DC

## 2012-07-14 NOTE — Telephone Encounter (Signed)
Pt aware glyburide e-scribed

## 2012-07-14 NOTE — Telephone Encounter (Signed)
Glyburide 5 mg qhs

## 2012-07-19 ENCOUNTER — Telehealth: Payer: Self-pay | Admitting: Obstetrics & Gynecology

## 2012-07-19 NOTE — Telephone Encounter (Signed)
Pt states had right sided pain yesterday but is not hurting today. Pt states no bleeding, positive FM, no pain today at all. Pt informed can take tylenol anytime during pregnancy, push fluids and rest. Pt does have an appointment tomorrow. Pt encouraged to call office back if pain reoccurs. Pt verbalized understanding and stated would keep her appt. For tomorrow.

## 2012-07-20 ENCOUNTER — Encounter: Payer: Self-pay | Admitting: Advanced Practice Midwife

## 2012-07-20 ENCOUNTER — Ambulatory Visit (INDEPENDENT_AMBULATORY_CARE_PROVIDER_SITE_OTHER): Payer: Medicaid Other | Admitting: Advanced Practice Midwife

## 2012-07-20 VITALS — BP 130/80 | Wt 238.0 lb

## 2012-07-20 DIAGNOSIS — O99340 Other mental disorders complicating pregnancy, unspecified trimester: Secondary | ICD-10-CM

## 2012-07-20 DIAGNOSIS — Z331 Pregnant state, incidental: Secondary | ICD-10-CM

## 2012-07-20 DIAGNOSIS — Z1389 Encounter for screening for other disorder: Secondary | ICD-10-CM

## 2012-07-20 DIAGNOSIS — O99810 Abnormal glucose complicating pregnancy: Secondary | ICD-10-CM

## 2012-07-20 DIAGNOSIS — O09299 Supervision of pregnancy with other poor reproductive or obstetric history, unspecified trimester: Secondary | ICD-10-CM

## 2012-07-20 DIAGNOSIS — O09529 Supervision of elderly multigravida, unspecified trimester: Secondary | ICD-10-CM

## 2012-07-20 LAB — POCT URINALYSIS DIPSTICK

## 2012-07-20 NOTE — Progress Notes (Signed)
Pt here today for routine visit and NST. Pt stated that she is having some right sided pain yesterday none at this time.

## 2012-07-20 NOTE — Progress Notes (Signed)
Dietician still has not scheduled appt.  All FBS 91-104.  Most PP <120.  Will add Glyburide 2.5mg  in the am.  NST reactive

## 2012-07-20 NOTE — Patient Instructions (Signed)
Take 1/2 tablet of Glyburide (2.5mg ) in the morning and 1 tablet (5mg ) in the evening.

## 2012-07-25 ENCOUNTER — Ambulatory Visit (INDEPENDENT_AMBULATORY_CARE_PROVIDER_SITE_OTHER): Payer: Medicaid Other | Admitting: Obstetrics & Gynecology

## 2012-07-25 VITALS — BP 148/80 | Wt 240.0 lb

## 2012-07-25 DIAGNOSIS — O09529 Supervision of elderly multigravida, unspecified trimester: Secondary | ICD-10-CM

## 2012-07-25 DIAGNOSIS — O99810 Abnormal glucose complicating pregnancy: Secondary | ICD-10-CM

## 2012-07-25 DIAGNOSIS — O09299 Supervision of pregnancy with other poor reproductive or obstetric history, unspecified trimester: Secondary | ICD-10-CM

## 2012-07-25 DIAGNOSIS — O24913 Unspecified diabetes mellitus in pregnancy, third trimester: Secondary | ICD-10-CM

## 2012-07-25 DIAGNOSIS — Z331 Pregnant state, incidental: Secondary | ICD-10-CM

## 2012-07-25 DIAGNOSIS — Z1389 Encounter for screening for other disorder: Secondary | ICD-10-CM

## 2012-07-25 DIAGNOSIS — O99340 Other mental disorders complicating pregnancy, unspecified trimester: Secondary | ICD-10-CM

## 2012-07-25 LAB — POCT URINALYSIS DIPSTICK
Blood, UA: NEGATIVE
Nitrite, UA: NEGATIVE

## 2012-07-25 NOTE — Progress Notes (Signed)
Reactive NST BP weight and urine results all reviewed and noted. Patient reports good fetal movement, denies any bleeding and no rupture of membranes symptoms or regular contractions. Patient is without complaints. All questions were answered. BP is a bit up and will watch

## 2012-07-26 ENCOUNTER — Encounter: Payer: Medicaid Other | Admitting: Obstetrics and Gynecology

## 2012-07-27 ENCOUNTER — Other Ambulatory Visit: Payer: Medicaid Other | Admitting: Obstetrics & Gynecology

## 2012-07-31 ENCOUNTER — Ambulatory Visit (INDEPENDENT_AMBULATORY_CARE_PROVIDER_SITE_OTHER): Payer: Medicaid Other | Admitting: Obstetrics & Gynecology

## 2012-07-31 VITALS — BP 140/84 | Wt 238.0 lb

## 2012-07-31 DIAGNOSIS — Z331 Pregnant state, incidental: Secondary | ICD-10-CM

## 2012-07-31 DIAGNOSIS — O24913 Unspecified diabetes mellitus in pregnancy, third trimester: Secondary | ICD-10-CM

## 2012-07-31 DIAGNOSIS — O24919 Unspecified diabetes mellitus in pregnancy, unspecified trimester: Secondary | ICD-10-CM

## 2012-07-31 DIAGNOSIS — O99810 Abnormal glucose complicating pregnancy: Secondary | ICD-10-CM

## 2012-07-31 DIAGNOSIS — O09529 Supervision of elderly multigravida, unspecified trimester: Secondary | ICD-10-CM

## 2012-07-31 DIAGNOSIS — Z1389 Encounter for screening for other disorder: Secondary | ICD-10-CM

## 2012-07-31 DIAGNOSIS — E119 Type 2 diabetes mellitus without complications: Secondary | ICD-10-CM

## 2012-07-31 DIAGNOSIS — O99340 Other mental disorders complicating pregnancy, unspecified trimester: Secondary | ICD-10-CM

## 2012-07-31 DIAGNOSIS — O09299 Supervision of pregnancy with other poor reproductive or obstetric history, unspecified trimester: Secondary | ICD-10-CM

## 2012-07-31 LAB — POCT URINALYSIS DIPSTICK
Blood, UA: NEGATIVE
Ketones, UA: NEGATIVE

## 2012-07-31 NOTE — Progress Notes (Signed)
C/o"vaginal pressure" 

## 2012-07-31 NOTE — Progress Notes (Signed)
BP weight and urine results all reviewed and noted. Patient reports good fetal movement, denies any bleeding and no rupture of membranes symptoms or regular contractions. Patient is without complaints. All questions were answered. Blood sugars are excellent.  Continue Glyburide 5 mg @hs  and 2.5 mg qam

## 2012-08-03 ENCOUNTER — Ambulatory Visit (INDEPENDENT_AMBULATORY_CARE_PROVIDER_SITE_OTHER): Payer: Medicaid Other | Admitting: Obstetrics & Gynecology

## 2012-08-03 VITALS — BP 120/80 | Wt 239.0 lb

## 2012-08-03 DIAGNOSIS — O99810 Abnormal glucose complicating pregnancy: Secondary | ICD-10-CM

## 2012-08-03 DIAGNOSIS — O0993 Supervision of high risk pregnancy, unspecified, third trimester: Secondary | ICD-10-CM

## 2012-08-03 DIAGNOSIS — O09899 Supervision of other high risk pregnancies, unspecified trimester: Secondary | ICD-10-CM

## 2012-08-03 DIAGNOSIS — Z331 Pregnant state, incidental: Secondary | ICD-10-CM

## 2012-08-03 DIAGNOSIS — Z1389 Encounter for screening for other disorder: Secondary | ICD-10-CM

## 2012-08-03 DIAGNOSIS — O99340 Other mental disorders complicating pregnancy, unspecified trimester: Secondary | ICD-10-CM

## 2012-08-03 DIAGNOSIS — O09299 Supervision of pregnancy with other poor reproductive or obstetric history, unspecified trimester: Secondary | ICD-10-CM

## 2012-08-03 DIAGNOSIS — O09529 Supervision of elderly multigravida, unspecified trimester: Secondary | ICD-10-CM

## 2012-08-03 LAB — POCT URINALYSIS DIPSTICK
Ketones, UA: NEGATIVE
Protein, UA: NEGATIVE

## 2012-08-03 NOTE — Progress Notes (Signed)
Blood sugars excellent BP weight and urine results all reviewed and noted. Patient reports good fetal movement, denies any bleeding and no rupture of membranes symptoms or regular contractions. Patient is without complaints. All questions were answered.

## 2012-08-07 ENCOUNTER — Encounter: Payer: Self-pay | Admitting: Obstetrics & Gynecology

## 2012-08-07 ENCOUNTER — Ambulatory Visit (INDEPENDENT_AMBULATORY_CARE_PROVIDER_SITE_OTHER): Payer: Medicaid Other | Admitting: Obstetrics & Gynecology

## 2012-08-07 VITALS — BP 120/80 | Wt 239.0 lb

## 2012-08-07 DIAGNOSIS — O99340 Other mental disorders complicating pregnancy, unspecified trimester: Secondary | ICD-10-CM

## 2012-08-07 DIAGNOSIS — Z331 Pregnant state, incidental: Secondary | ICD-10-CM

## 2012-08-07 DIAGNOSIS — O99810 Abnormal glucose complicating pregnancy: Secondary | ICD-10-CM

## 2012-08-07 DIAGNOSIS — O09299 Supervision of pregnancy with other poor reproductive or obstetric history, unspecified trimester: Secondary | ICD-10-CM

## 2012-08-07 DIAGNOSIS — Z1389 Encounter for screening for other disorder: Secondary | ICD-10-CM

## 2012-08-07 DIAGNOSIS — O09529 Supervision of elderly multigravida, unspecified trimester: Secondary | ICD-10-CM

## 2012-08-07 DIAGNOSIS — O09899 Supervision of other high risk pregnancies, unspecified trimester: Secondary | ICD-10-CM

## 2012-08-07 DIAGNOSIS — O0993 Supervision of high risk pregnancy, unspecified, third trimester: Secondary | ICD-10-CM

## 2012-08-07 LAB — POCT URINALYSIS DIPSTICK
Glucose, UA: NEGATIVE
Ketones, UA: NEGATIVE
Leukocytes, UA: NEGATIVE
Nitrite, UA: NEGATIVE

## 2012-08-07 MED ORDER — GLYBURIDE 5 MG PO TABS: ORAL_TABLET | ORAL | Status: DC

## 2012-08-07 NOTE — Progress Notes (Signed)
BP weight and urine results all reviewed and noted. Patient reports good fetal movement, denies any bleeding and no rupture of membranes symptoms or regular contractions. Patient is without complaints. All questions were answered. Blood sugars are good.  Glyburid 5 at hs and 2.5 mg am

## 2012-08-09 ENCOUNTER — Encounter: Payer: Medicaid Other | Attending: Advanced Practice Midwife | Admitting: *Deleted

## 2012-08-09 ENCOUNTER — Encounter: Payer: Self-pay | Admitting: *Deleted

## 2012-08-09 VITALS — Ht 69.0 in | Wt 240.6 lb

## 2012-08-09 DIAGNOSIS — Z713 Dietary counseling and surveillance: Secondary | ICD-10-CM | POA: Insufficient documentation

## 2012-08-09 DIAGNOSIS — E119 Type 2 diabetes mellitus without complications: Secondary | ICD-10-CM

## 2012-08-09 DIAGNOSIS — O99810 Abnormal glucose complicating pregnancy: Secondary | ICD-10-CM | POA: Insufficient documentation

## 2012-08-09 NOTE — Progress Notes (Signed)
  Patient was seen on 08/09/2012 for Gestational Diabetes self-management class at the [facility]. The following learning objectives were met by the patient during this course:   States the definition of Gestational Diabetes  States why dietary management is important in controlling blood glucose  Describes the effects each nutrient has on blood glucose levels  Demonstrates ability to create a balanced meal plan  Demonstrates carbohydrate counting   States when to check blood glucose levels  Demonstrates proper blood glucose monitoring techniques  States the effect of stress and exercise on blood glucose levels  States the importance of limiting caffeine and abstaining from alcohol and smoking  Blood glucose monitor given: Accu Chek Nano BG Monitoring Kit Lot # [id number]  Exp: 09/28/2013 Blood glucose reading: 77 mg/dl  Patient instructed to monitor glucose levels: FBS: 60 - <90 2 hour: <120  *Patient received handouts:  Nutrition Diabetes and Pregnancy  Carbohydrate Counting List  Patient will be seen for follow-up as needed.

## 2012-08-09 NOTE — Patient Instructions (Signed)
Goals:  Check glucose levels per MD as instructed  Follow Gestational Diabetes Diet as instructed  Call for follow-up as needed    

## 2012-08-10 ENCOUNTER — Other Ambulatory Visit: Payer: Medicaid Other | Admitting: Obstetrics and Gynecology

## 2012-08-11 ENCOUNTER — Ambulatory Visit (INDEPENDENT_AMBULATORY_CARE_PROVIDER_SITE_OTHER): Payer: Medicaid Other | Admitting: Obstetrics and Gynecology

## 2012-08-11 ENCOUNTER — Encounter: Payer: Self-pay | Admitting: Obstetrics and Gynecology

## 2012-08-11 ENCOUNTER — Other Ambulatory Visit: Payer: Medicaid Other

## 2012-08-11 VITALS — BP 120/80 | Wt 238.2 lb

## 2012-08-11 DIAGNOSIS — O24419 Gestational diabetes mellitus in pregnancy, unspecified control: Secondary | ICD-10-CM

## 2012-08-11 DIAGNOSIS — Z331 Pregnant state, incidental: Secondary | ICD-10-CM

## 2012-08-11 DIAGNOSIS — Z1389 Encounter for screening for other disorder: Secondary | ICD-10-CM

## 2012-08-11 LAB — POCT URINALYSIS DIPSTICK
Glucose, UA: NEGATIVE
Nitrite, UA: NEGATIVE

## 2012-08-11 NOTE — Progress Notes (Signed)
Pt here today for routine visit and NST. Pt states she has good baby movement and some pain and pressure that comes and goes, has more in the afternoons. Pt denies any other issues at this time.  NST: reactive. CBG's excellent, fastings 70-80's,  2hrPC usually <119, only 2 over 130, dietician class completed last wk A A2DM stable on Glyburide 2.5 q am,5 qhs    Prior C/s for Repeat + BTL--> needs scheduling for July 10( Thursday)

## 2012-08-14 ENCOUNTER — Ambulatory Visit (INDEPENDENT_AMBULATORY_CARE_PROVIDER_SITE_OTHER): Payer: Medicaid Other | Admitting: Obstetrics & Gynecology

## 2012-08-14 VITALS — BP 120/80 | Wt 240.0 lb

## 2012-08-14 DIAGNOSIS — O24913 Unspecified diabetes mellitus in pregnancy, third trimester: Secondary | ICD-10-CM

## 2012-08-14 DIAGNOSIS — O24919 Unspecified diabetes mellitus in pregnancy, unspecified trimester: Secondary | ICD-10-CM

## 2012-08-14 DIAGNOSIS — Z331 Pregnant state, incidental: Secondary | ICD-10-CM

## 2012-08-14 DIAGNOSIS — Z1389 Encounter for screening for other disorder: Secondary | ICD-10-CM

## 2012-08-14 DIAGNOSIS — O34219 Maternal care for unspecified type scar from previous cesarean delivery: Secondary | ICD-10-CM

## 2012-08-14 LAB — POCT URINALYSIS DIPSTICK
Blood, UA: NEGATIVE
Protein, UA: NEGATIVE

## 2012-08-14 NOTE — Progress Notes (Signed)
C/o "little bit of pressure, comes and goes,"   NST today.  Wrong encounter type  See other encounter for today

## 2012-08-14 NOTE — Progress Notes (Signed)
BP weight and urine results all reviewed and noted. Patient reports good fetal movement, denies any bleeding and no rupture of membranes symptoms or regular contractions. Patient is without complaints. All questions were answered. Blood sugars excellent and and NST reactive

## 2012-08-16 ENCOUNTER — Other Ambulatory Visit: Payer: Self-pay | Admitting: Obstetrics & Gynecology

## 2012-08-16 DIAGNOSIS — O99810 Abnormal glucose complicating pregnancy: Secondary | ICD-10-CM

## 2012-08-17 ENCOUNTER — Encounter: Payer: Self-pay | Admitting: Obstetrics & Gynecology

## 2012-08-17 ENCOUNTER — Ambulatory Visit (INDEPENDENT_AMBULATORY_CARE_PROVIDER_SITE_OTHER): Payer: Medicaid Other | Admitting: Obstetrics & Gynecology

## 2012-08-17 VITALS — BP 138/90 | Wt 239.0 lb

## 2012-08-17 DIAGNOSIS — O34219 Maternal care for unspecified type scar from previous cesarean delivery: Secondary | ICD-10-CM

## 2012-08-17 DIAGNOSIS — Z1389 Encounter for screening for other disorder: Secondary | ICD-10-CM

## 2012-08-17 DIAGNOSIS — Z331 Pregnant state, incidental: Secondary | ICD-10-CM

## 2012-08-17 DIAGNOSIS — O24919 Unspecified diabetes mellitus in pregnancy, unspecified trimester: Secondary | ICD-10-CM

## 2012-08-17 LAB — POCT URINALYSIS DIPSTICK
Blood, UA: NEGATIVE
Ketones, UA: 3
Protein, UA: 1

## 2012-08-17 NOTE — Progress Notes (Signed)
Glyburide 2.5 mg a.m. and 5 mg in the evening BP weight and urine results all reviewed and noted. Patient reports good fetal movement, denies any bleeding and no rupture of membranes symptoms or regular contractions. Patient is without complaints. All questions were answered. Sonogram is scheduled for Monday and continue NST next Thursday Reactive NSTtoday and all her  Blood sugars are excellent

## 2012-08-21 ENCOUNTER — Encounter: Payer: Self-pay | Admitting: Women's Health

## 2012-08-21 ENCOUNTER — Ambulatory Visit (INDEPENDENT_AMBULATORY_CARE_PROVIDER_SITE_OTHER): Payer: Medicaid Other | Admitting: Women's Health

## 2012-08-21 ENCOUNTER — Ambulatory Visit (INDEPENDENT_AMBULATORY_CARE_PROVIDER_SITE_OTHER): Payer: Medicaid Other

## 2012-08-21 VITALS — BP 120/78 | Wt 243.0 lb

## 2012-08-21 DIAGNOSIS — O99810 Abnormal glucose complicating pregnancy: Secondary | ICD-10-CM

## 2012-08-21 DIAGNOSIS — O0993 Supervision of high risk pregnancy, unspecified, third trimester: Secondary | ICD-10-CM

## 2012-08-21 DIAGNOSIS — R768 Other specified abnormal immunological findings in serum: Secondary | ICD-10-CM

## 2012-08-21 DIAGNOSIS — Z331 Pregnant state, incidental: Secondary | ICD-10-CM

## 2012-08-21 DIAGNOSIS — O98519 Other viral diseases complicating pregnancy, unspecified trimester: Secondary | ICD-10-CM

## 2012-08-21 DIAGNOSIS — O24913 Unspecified diabetes mellitus in pregnancy, third trimester: Secondary | ICD-10-CM

## 2012-08-21 DIAGNOSIS — O09529 Supervision of elderly multigravida, unspecified trimester: Secondary | ICD-10-CM

## 2012-08-21 DIAGNOSIS — IMO0002 Reserved for concepts with insufficient information to code with codable children: Secondary | ICD-10-CM | POA: Insufficient documentation

## 2012-08-21 DIAGNOSIS — B009 Herpesviral infection, unspecified: Secondary | ICD-10-CM

## 2012-08-21 DIAGNOSIS — Z1389 Encounter for screening for other disorder: Secondary | ICD-10-CM

## 2012-08-21 DIAGNOSIS — E039 Hypothyroidism, unspecified: Secondary | ICD-10-CM

## 2012-08-21 DIAGNOSIS — O09519 Supervision of elderly primigravida, unspecified trimester: Secondary | ICD-10-CM

## 2012-08-21 DIAGNOSIS — O99280 Endocrine, nutritional and metabolic diseases complicating pregnancy, unspecified trimester: Secondary | ICD-10-CM

## 2012-08-21 LAB — POCT URINALYSIS DIPSTICK
Blood, UA: NEGATIVE
Ketones, UA: NEGATIVE
Leukocytes, UA: NEGATIVE

## 2012-08-21 MED ORDER — ACYCLOVIR 400 MG PO TABS: 400.0000 mg | ORAL_TABLET | Freq: Three times a day (TID) | ORAL | Status: DC

## 2012-08-21 NOTE — Patient Instructions (Signed)

## 2012-08-21 NOTE — Progress Notes (Signed)
Reports good fm. Denies uc's, lof, vb, urinary frequency, urgency, hesitancy, or dysuria.  No complaints.  CBGs: 68-98 only 1 above 90, 2hr pp: 87-138 w/ 3 >120. Discussed diet. Reviewed ptl s/s, fetal kick counts.  All questions answered. GBS, gc/ch today. F/U on Thurs for NST.

## 2012-08-21 NOTE — Progress Notes (Signed)
U/S (36+4wks)-vtx active fetus, BPP 8/8, fluid wnl AFI = 10.8cm, ant gr 1 plac, approp growth EFW 7 lb 4 oz 74th%tile, female fetus "Rylan"

## 2012-08-22 ENCOUNTER — Encounter: Payer: Self-pay | Admitting: Women's Health

## 2012-08-22 LAB — GC/CHLAMYDIA PROBE AMP
CT Probe RNA: NEGATIVE
GC Probe RNA: NEGATIVE

## 2012-08-24 ENCOUNTER — Telehealth: Payer: Self-pay | Admitting: *Deleted

## 2012-08-24 ENCOUNTER — Other Ambulatory Visit: Payer: Medicaid Other | Admitting: Obstetrics & Gynecology

## 2012-08-24 MED ORDER — ONDANSETRON HCL 8 MG PO TABS: 8.0000 mg | ORAL_TABLET | Freq: Three times a day (TID) | ORAL | Status: DC | PRN

## 2012-08-24 NOTE — Telephone Encounter (Signed)
Pt husband called and stated pt continues to have nausea, vomiting, and diarrhea. Reschedule appt and NST till tomorrow.    Can Zofran be e-scribed?

## 2012-08-24 NOTE — Telephone Encounter (Signed)
Pt states has a "stomach bug" nausea, vomiting, and diarrhea, +FM. States has an appt today for NST and to see Dr. Despina Hidden, wants to reschedule. Per Cyril Mourning, NP pt to call office back this afternoon to see if she is feeling any better and to work pt in on the afternoon schedule if not we can reschedule for tomorrow. Pt verbalized understanding.

## 2012-08-24 NOTE — Telephone Encounter (Signed)
Pt notified that Zofran e-scribed and appointment for NST and provider rescheduled to Monday, August 28, 2012. Pt verbalized understanding.

## 2012-08-25 ENCOUNTER — Other Ambulatory Visit: Payer: Medicaid Other | Admitting: Obstetrics & Gynecology

## 2012-08-26 ENCOUNTER — Encounter: Payer: Self-pay | Admitting: Women's Health

## 2012-08-28 ENCOUNTER — Other Ambulatory Visit: Payer: Medicaid Other

## 2012-08-28 ENCOUNTER — Encounter: Payer: Self-pay | Admitting: Obstetrics & Gynecology

## 2012-08-28 ENCOUNTER — Ambulatory Visit (INDEPENDENT_AMBULATORY_CARE_PROVIDER_SITE_OTHER): Payer: Medicaid Other | Admitting: Obstetrics & Gynecology

## 2012-08-28 VITALS — BP 152/90 | Wt 242.0 lb

## 2012-08-28 DIAGNOSIS — Z98891 History of uterine scar from previous surgery: Secondary | ICD-10-CM

## 2012-08-28 DIAGNOSIS — O99810 Abnormal glucose complicating pregnancy: Secondary | ICD-10-CM

## 2012-08-28 DIAGNOSIS — Z331 Pregnant state, incidental: Secondary | ICD-10-CM

## 2012-08-28 DIAGNOSIS — O24419 Gestational diabetes mellitus in pregnancy, unspecified control: Secondary | ICD-10-CM

## 2012-08-28 DIAGNOSIS — Z1389 Encounter for screening for other disorder: Secondary | ICD-10-CM

## 2012-08-28 DIAGNOSIS — O0993 Supervision of high risk pregnancy, unspecified, third trimester: Secondary | ICD-10-CM

## 2012-08-28 DIAGNOSIS — O09529 Supervision of elderly multigravida, unspecified trimester: Secondary | ICD-10-CM

## 2012-08-28 DIAGNOSIS — O09899 Supervision of other high risk pregnancies, unspecified trimester: Secondary | ICD-10-CM

## 2012-08-28 LAB — POCT URINALYSIS DIPSTICK
Ketones, UA: NEGATIVE
Leukocytes, UA: NEGATIVE

## 2012-08-28 NOTE — Progress Notes (Signed)
BP is same general range, borderline Reactive NST   Blood sugars are good Good fetal movment no bleeding no complaints

## 2012-08-28 NOTE — Patient Instructions (Signed)
Cesarean Delivery  Cesarean delivery is the birth of a baby through a cut (incision) in the abdomen and womb (uterus).  LET YOUR CAREGIVER KNOW ABOUT:  Complicationsinvolving the pregnancy.  Allergies.  Medicines taken including herbs, eyedrops, over-the-counter medicines, and creams.  Use of steroids (by mouth or creams).  Previous problems with anesthetics or numbing medicine.  Previous surgery.  History of blood clots.  History of bleeding or blood problems.  Other health problems. RISKS AND COMPLICATIONS   Bleeding.  Infection.  Blood clots.  Injury to surrounding organs.  Anesthesia problems.  Injury to the baby. BEFORE THE PROCEDURE   A tube (Foley catheter) will be placed in your bladder. The Foley catheter drains the urine from your bladder into a bag. This keeps your bladder empty during surgery.  An intravenous access tube (IV) will be placed in your arm.  Hair may be removed from your pubic area and your lower abdomen. This is to prevent infection in the incision site.  You may be given an antacid medicine to drink. This will prevent acid contents in your stomach from going into your lungs if you vomit during the surgery.  You may be given an antibiotic medicine to prevent infection. PROCEDURE   You may be given medicine to numb the lower half of your body (regional anesthetic). If you were in labor, you may have already had an epidural in place which can be used in both labor and cesarean delivery. You may possibly be given medicine to make you sleep (general anesthetic) though this is not as common.  An incision will be made in your abdomen that extends to your uterus. There are 2 basic kinds of incisions:  The horizontal (transverse) incision. Horizontal incisions are used for most routine cesarean deliveries.  The vertical (up and down) incision. This is less commonly used. This is most often reserved for women who have a serious complication  (extreme prematurity) or under emergency situations.  The horizontal and vertical incisions may both be used at the same time. However, this is very uncommon.  Your baby will then be delivered. AFTER THE PROCEDURE   If you were awake during the surgery, you will see your baby right away. If you were asleep, you will see your baby as soon as you are awake.  You may breastfeed your baby after surgery.  You may be able to get up and walk the same day as the surgery. If you need to stay in bed for a period of time, you will receive help to turn, cough, and take deep breaths after surgery. This helps prevent lung problems such as pneumonia.  Do not get out of bed alone the first time after surgery. You will need help getting out of bed until you are able to do this by yourself.  You may be able to shower the day after your cesarean delivery. After the bandage (dressing) is taken off the incision site, a nurse will assist you to shower, if you like.  You will have pneumatic compressing hose placed on your feet or lower legs. These hose are used to prevent blood clots. When you are up and walking regularly, they will no longer be necessary.  Do not cross your legs when you sit.  Save any blood clots that you pass. If you pass a clot while on the toilet, do not flush it. Call for the nurse. Tell the nurse if you think you are bleeding too much or passing too many   clots.  Start drinking liquids and eating food as directed by your caregiver. If your stomach is not ready, drinking and eating too soon can cause an increase in bloating and swelling of your intestine and abdomen. This is very uncomfortable.  You will be given medicine as needed. Let your caregivers know if you are hurting. They want you to be comfortable. You may also be given an antibiotic to prevent an infection.  Your IV will be taken out when you are drinking a reasonable amount of fluids. The Foley catheter is taken out when  you are up and walking.  If your blood type is Rh negative and your baby's blood type is Rh positive, you will be given a shot of anti-D immune globulin. This shot prevents you from having Rh problems with a future pregnancy. You should get the shot even if you had your tubes tied (tubal ligation).  If you are allowed to take the baby for a walk, place the baby in the bassinet and push it. Do not carry your baby in your arms. Document Released: 02/15/2005 Document Revised: 05/10/2011 Document Reviewed: 06/12/2010 [facility] Patient Information 2014 [place], [place].

## 2012-08-31 ENCOUNTER — Encounter (HOSPITAL_COMMUNITY): Payer: Self-pay | Admitting: Pharmacy Technician

## 2012-08-31 ENCOUNTER — Other Ambulatory Visit: Payer: Medicaid Other | Admitting: Women's Health

## 2012-08-31 ENCOUNTER — Ambulatory Visit (INDEPENDENT_AMBULATORY_CARE_PROVIDER_SITE_OTHER): Payer: Medicaid Other | Admitting: Obstetrics and Gynecology

## 2012-08-31 VITALS — BP 130/78 | Wt 241.4 lb

## 2012-08-31 DIAGNOSIS — O0990 Supervision of high risk pregnancy, unspecified, unspecified trimester: Secondary | ICD-10-CM

## 2012-08-31 DIAGNOSIS — Z331 Pregnant state, incidental: Secondary | ICD-10-CM

## 2012-08-31 DIAGNOSIS — O99810 Abnormal glucose complicating pregnancy: Secondary | ICD-10-CM

## 2012-08-31 DIAGNOSIS — Z1389 Encounter for screening for other disorder: Secondary | ICD-10-CM

## 2012-08-31 DIAGNOSIS — O0993 Supervision of high risk pregnancy, unspecified, third trimester: Secondary | ICD-10-CM

## 2012-08-31 DIAGNOSIS — O09529 Supervision of elderly multigravida, unspecified trimester: Secondary | ICD-10-CM

## 2012-08-31 LAB — POCT URINALYSIS DIPSTICK
Blood, UA: NEGATIVE
Glucose, UA: NEGATIVE
Nitrite, UA: NEGATIVE

## 2012-08-31 NOTE — Progress Notes (Signed)
C/o "a lot of vaginal pressure" 

## 2012-08-31 NOTE — Patient Instructions (Signed)
Keep preop labs next tues.

## 2012-08-31 NOTE — Progress Notes (Signed)
BP good. - H/a, - scotoma, pt on Synthroid CBG's fastings59-91, PC's 82-132. Normal energy levels  NST reactive.  Good FM  A: 38.0 wk, A2 dm, good control, Hypothyroid TSH 4.494 as of 6/30.Marland Kitchen P continue NST, biweekly , preop Tues 10am, Cesarean + BTL 7/10 Dr Despina Hidden.

## 2012-09-04 ENCOUNTER — Ambulatory Visit (INDEPENDENT_AMBULATORY_CARE_PROVIDER_SITE_OTHER): Payer: Medicaid Other | Admitting: Obstetrics & Gynecology

## 2012-09-04 DIAGNOSIS — Z1389 Encounter for screening for other disorder: Secondary | ICD-10-CM

## 2012-09-04 DIAGNOSIS — Z331 Pregnant state, incidental: Secondary | ICD-10-CM

## 2012-09-04 DIAGNOSIS — O99810 Abnormal glucose complicating pregnancy: Secondary | ICD-10-CM

## 2012-09-04 LAB — POCT URINALYSIS DIPSTICK
Blood, UA: NEGATIVE
Leukocytes, UA: NEGATIVE

## 2012-09-04 NOTE — Addendum Note (Signed)
Addended by: Richardson Chiquito on: 09/04/2012 01:43 PM   Modules accepted: Orders

## 2012-09-04 NOTE — Progress Notes (Signed)
Reactive NST CS on Thursday BP weight and urine results all reviewed and noted. Patient reports good fetal movement, denies any bleeding and no rupture of membranes symptoms or regular contractions. Patient is without complaints. All questions were answered.

## 2012-09-05 ENCOUNTER — Encounter (HOSPITAL_COMMUNITY): Payer: Self-pay

## 2012-09-05 ENCOUNTER — Encounter (HOSPITAL_COMMUNITY)
Admission: RE | Admit: 2012-09-05 | Discharge: 2012-09-05 | Disposition: A | Discharge status: Home / Self Care | Payer: Medicaid Other | Source: Ambulatory Visit | Attending: Obstetrics & Gynecology | Admitting: Obstetrics & Gynecology

## 2012-09-05 VITALS — BP 122/80 | HR 95 | Temp 98.0°F | Resp 16 | Ht 69.0 in | Wt 236.0 lb

## 2012-09-05 DIAGNOSIS — Z98891 History of uterine scar from previous surgery: Secondary | ICD-10-CM

## 2012-09-05 DIAGNOSIS — O24419 Gestational diabetes mellitus in pregnancy, unspecified control: Secondary | ICD-10-CM

## 2012-09-05 HISTORY — DX: Depression, unspecified: F32.A

## 2012-09-05 HISTORY — DX: Major depressive disorder, single episode, unspecified: F32.9

## 2012-09-05 HISTORY — DX: Congenital malformation of heart, unspecified: Q24.9

## 2012-09-05 LAB — COMPREHENSIVE METABOLIC PANEL
ALT: 9 U/L (ref 0–35)
AST: 15 U/L (ref 0–37)
Albumin: 2.6 g/dL — ABNORMAL LOW (ref 3.5–5.2)
Alkaline Phosphatase: 121 U/L — ABNORMAL HIGH (ref 39–117)
BUN: 9 mg/dL (ref 6–23)
CO2: 21 mEq/L (ref 19–32)
Calcium: 9.2 mg/dL (ref 8.4–10.5)
Chloride: 103 mEq/L (ref 96–112)
Creatinine, Ser: 0.6 mg/dL (ref 0.50–1.10)
GFR calc Af Amer: >90 mL/min (ref >90)
GFR calc non Af Amer: >90 mL/min (ref >90)
Glucose, Bld: 76 mg/dL (ref 70–99)
Potassium: 3.9 mEq/L (ref 3.5–5.1)
Sodium: 136 mEq/L (ref 135–145)
Total Bilirubin: 0.4 mg/dL (ref 0.3–1.2)
Total Protein: 6.6 g/dL (ref 6.0–8.3)

## 2012-09-05 LAB — ABO/RH: ABO/RH(D): O POS

## 2012-09-05 LAB — RPR: RPR Ser Ql: NONREACTIVE

## 2012-09-05 LAB — TYPE AND SCREEN
ABO/RH(D): O POS
Antibody Screen: NEGATIVE

## 2012-09-05 LAB — CBC
Hemoglobin: 12.2 g/dL (ref 12.0–15.0)
MCHC: 33.7 g/dL (ref 30.0–36.0)
Platelets: 227 10*3/uL (ref 150–400)
RDW: 13.9 % (ref 11.5–15.5)

## 2012-09-05 NOTE — Patient Instructions (Addendum)
    Your procedure is scheduled on: Thursday, July 10th Enter through the Main Entrance of [facility] at: 6 am  Pick up the phone at the desk and dial [phone] and inform us of your arrival.  Please call this number if you have any problems the morning of surgery: [phone]  Remember: Do not drink any liquids or eat any solid foods after midnight on: Thursday  Do not take Glyburide on the morning of your surgery. Please bring your inhalers with you when you to the hospital.  Please take these medications, with sips of water, on the morning of surgery: Zovirax, Synthroid, Zoloft, and Advair. Take Zofran and Proventil only if needed.  Do not wear jewelry, make-up, or FINGER nail polish No metal in your hair or on your body. Do not wear lotions, powders, perfumes. You may wear deodorant.  Please use your CHG wash as directed prior to surgery.  Do not shave anywhere for at least 12 hours prior to first CHG shower.  Do not bring valuables to the hospital. Contacts, Dentures and Partial Plates may not be worn to OR  Leave suitcase in the car. After Surgery it may be brought to your room.  For patients being admitted to the hospital, checkout time is 11:00am the day of discharge.

## 2012-09-06 NOTE — Anesthesia Preprocedure Evaluation (Addendum)
Anesthesia Evaluation  Patient identified by MRN, date of birth, ID band Patient awake    Reviewed: Allergy & Precautions, H&P , NPO status , Patient's Chart, lab work & pertinent test results  Airway Mallampati: II TM Distance: >3 FB Neck ROM: Full    Dental  (+) Dental Advisory Given and Teeth Intact   Pulmonary asthma ,  breath sounds clear to auscultation        Cardiovascular negative cardio ROS  Rhythm:Regular Rate:Normal     Neuro/Psych PSYCHIATRIC DISORDERS Depression negative neurological ROS     GI/Hepatic negative GI ROS, Neg liver ROS,   Endo/Other  diabetes, Type 2, Oral Hypoglycemic AgentsHypothyroidism Morbid obesity  Renal/GU negative Renal ROS     Musculoskeletal negative musculoskeletal ROS (+) Fibromyalgia -  Abdominal   Peds  Hematology negative hematology ROS (+)   Anesthesia Other Findings   Reproductive/Obstetrics negative OB ROS                          Anesthesia Physical Anesthesia Plan  ASA: III  Anesthesia Plan: Spinal   Post-op Pain Management:    Induction:   Airway Management Planned:   Additional Equipment:   Intra-op Plan:   Post-operative Plan:   Informed Consent: I have reviewed the patients History and Physical, chart, labs and discussed the procedure including the risks, benefits and alternatives for the proposed anesthesia with the patient or authorized representative who has indicated his/her understanding and acceptance.   Dental advisory given  Plan Discussed with: CRNA  Anesthesia Plan Comments:         Anesthesia Quick Evaluation

## 2012-09-07 ENCOUNTER — Inpatient Hospital Stay (HOSPITAL_COMMUNITY): Payer: Medicaid Other | Admitting: Anesthesiology

## 2012-09-07 ENCOUNTER — Encounter (HOSPITAL_COMMUNITY): Payer: Self-pay | Admitting: Anesthesiology

## 2012-09-07 ENCOUNTER — Encounter (HOSPITAL_COMMUNITY)
Admission: AD | Disposition: A | Discharge status: Home / Self Care | Payer: Self-pay | Source: Ambulatory Visit | Attending: Obstetrics & Gynecology

## 2012-09-07 ENCOUNTER — Inpatient Hospital Stay (HOSPITAL_COMMUNITY)
Admission: AD | Admit: 2012-09-07 | Discharge: 2012-09-09 | DRG: 766 | Disposition: A | Discharge status: Home / Self Care | Payer: Medicaid Other | Source: Ambulatory Visit | Attending: Obstetrics & Gynecology | Admitting: Obstetrics & Gynecology

## 2012-09-07 DIAGNOSIS — O98519 Other viral diseases complicating pregnancy, unspecified trimester: Secondary | ICD-10-CM

## 2012-09-07 DIAGNOSIS — E079 Disorder of thyroid, unspecified: Secondary | ICD-10-CM | POA: Diagnosis present

## 2012-09-07 DIAGNOSIS — O09529 Supervision of elderly multigravida, unspecified trimester: Secondary | ICD-10-CM | POA: Diagnosis present

## 2012-09-07 DIAGNOSIS — O99814 Abnormal glucose complicating childbirth: Secondary | ICD-10-CM | POA: Diagnosis present

## 2012-09-07 DIAGNOSIS — O34219 Maternal care for unspecified type scar from previous cesarean delivery: Secondary | ICD-10-CM

## 2012-09-07 DIAGNOSIS — O99284 Endocrine, nutritional and metabolic diseases complicating childbirth: Secondary | ICD-10-CM | POA: Diagnosis present

## 2012-09-07 DIAGNOSIS — Z302 Encounter for sterilization: Secondary | ICD-10-CM

## 2012-09-07 DIAGNOSIS — E039 Hypothyroidism, unspecified: Secondary | ICD-10-CM

## 2012-09-07 DIAGNOSIS — Z98891 History of uterine scar from previous surgery: Secondary | ICD-10-CM

## 2012-09-07 DIAGNOSIS — O24419 Gestational diabetes mellitus in pregnancy, unspecified control: Secondary | ICD-10-CM

## 2012-09-07 DIAGNOSIS — O0990 Supervision of high risk pregnancy, unspecified, unspecified trimester: Secondary | ICD-10-CM

## 2012-09-07 LAB — URINALYSIS, ROUTINE W REFLEX MICROSCOPIC
Glucose, UA: NEGATIVE mg/dL
Hgb urine dipstick: NEGATIVE
pH: 6 (ref 5.0–8.0)

## 2012-09-07 LAB — GLUCOSE, CAPILLARY: Glucose-Capillary: 76 mg/dL (ref 70–99)

## 2012-09-07 LAB — URINE MICROSCOPIC-ADD ON

## 2012-09-07 SURGERY — Surgical Case
Anesthesia: Spinal | Site: Abdomen | Wound class: Clean Contaminated

## 2012-09-07 MED ORDER — MIDAZOLAM HCL 2 MG/2ML IJ SOLN: 0.5000 mg | Freq: Once | INTRAMUSCULAR | Status: DC | PRN

## 2012-09-07 MED ORDER — DIBUCAINE 1 % RE OINT: 1.0000 "application " | TOPICAL_OINTMENT | RECTAL | Status: DC | PRN

## 2012-09-07 MED ORDER — SCOPOLAMINE 1 MG/3DAYS TD PT72: 1.0000 | MEDICATED_PATCH | Freq: Once | TRANSDERMAL | Status: DC

## 2012-09-07 MED ORDER — KETOROLAC TROMETHAMINE 30 MG/ML IJ SOLN
INTRAMUSCULAR | Status: AC
  Administered 2012-09-07: 30 mg via INTRAMUSCULAR
  Filled 2012-09-07: qty 1

## 2012-09-07 MED ORDER — PHENYLEPHRINE 40 MCG/ML (10ML) SYRINGE FOR IV PUSH (FOR BLOOD PRESSURE SUPPORT)
PREFILLED_SYRINGE | INTRAVENOUS | Status: AC
  Filled 2012-09-07: qty 5

## 2012-09-07 MED ORDER — ONDANSETRON HCL 4 MG/2ML IJ SOLN: 4.0000 mg | Freq: Three times a day (TID) | INTRAMUSCULAR | Status: DC | PRN

## 2012-09-07 MED ORDER — BUPIVACAINE LIPOSOME 1.3 % IJ SUSP
INTRAMUSCULAR | Status: DC | PRN
  Administered 2012-09-07: 20 mL

## 2012-09-07 MED ORDER — LANOLIN HYDROUS EX OINT: 1.0000 "application " | TOPICAL_OINTMENT | CUTANEOUS | Status: DC | PRN

## 2012-09-07 MED ORDER — PROMETHAZINE HCL 25 MG/ML IJ SOLN: 6.2500 mg | INTRAMUSCULAR | Status: DC | PRN

## 2012-09-07 MED ORDER — NALBUPHINE SYRINGE 5 MG/0.5 ML
INJECTION | INTRAMUSCULAR | Status: AC
  Administered 2012-09-07: 10 mg via INTRAVENOUS
  Filled 2012-09-07: qty 1

## 2012-09-07 MED ORDER — ONDANSETRON HCL 4 MG PO TABS: 4.0000 mg | ORAL_TABLET | ORAL | Status: DC | PRN

## 2012-09-07 MED ORDER — FENTANYL CITRATE 0.05 MG/ML IJ SOLN
INTRAMUSCULAR | Status: AC
  Filled 2012-09-07: qty 2

## 2012-09-07 MED ORDER — CEFAZOLIN SODIUM-DEXTROSE 2-3 GM-% IV SOLR
2.0000 g | INTRAVENOUS | Status: AC
  Administered 2012-09-07: 2 g via INTRAVENOUS

## 2012-09-07 MED ORDER — LEVOTHYROXINE SODIUM 200 MCG PO TABS
200.0000 ug | ORAL_TABLET | Freq: Every day | ORAL | Status: DC
  Administered 2012-09-09: 200 ug via ORAL
  Filled 2012-09-07 (×2): qty 1

## 2012-09-07 MED ORDER — PNEUMOCOCCAL VAC POLYVALENT 25 MCG/0.5ML IJ INJ
0.5000 mL | INJECTION | INTRAMUSCULAR | Status: AC
  Administered 2012-09-08: 0.5 mL via INTRAMUSCULAR
  Filled 2012-09-07 (×2): qty 0.5

## 2012-09-07 MED ORDER — DEXTROSE 5 % IV SOLN
1.0000 ug/kg/h | INTRAVENOUS | Status: DC | PRN
  Filled 2012-09-07: qty 2

## 2012-09-07 MED ORDER — KETOROLAC TROMETHAMINE 30 MG/ML IJ SOLN: 30.0000 mg | Freq: Four times a day (QID) | INTRAMUSCULAR | Status: AC | PRN

## 2012-09-07 MED ORDER — IBUPROFEN 600 MG PO TABS
600.0000 mg | ORAL_TABLET | Freq: Four times a day (QID) | ORAL | Status: DC
  Administered 2012-09-07 – 2012-09-09 (×8): 600 mg via ORAL
  Filled 2012-09-07 (×9): qty 1

## 2012-09-07 MED ORDER — SODIUM CHLORIDE 0.9 % IJ SOLN
INTRAMUSCULAR | Status: DC | PRN
  Administered 2012-09-07: 40 mL

## 2012-09-07 MED ORDER — ONDANSETRON HCL 4 MG/2ML IJ SOLN
INTRAMUSCULAR | Status: DC | PRN
  Administered 2012-09-07: 4 mg via INTRAVENOUS

## 2012-09-07 MED ORDER — LACTATED RINGERS IV SOLN
INTRAVENOUS | Status: DC
  Administered 2012-09-07: 17:00:00 via INTRAVENOUS

## 2012-09-07 MED ORDER — FLEET ENEMA 7-19 GM/118ML RE ENEM: 1.0000 | ENEMA | Freq: Every day | RECTAL | Status: DC | PRN

## 2012-09-07 MED ORDER — METOCLOPRAMIDE HCL 5 MG/ML IJ SOLN
10.0000 mg | Freq: Three times a day (TID) | INTRAMUSCULAR | Status: DC | PRN
Start: 2012-09-07 — End: 2012-09-09

## 2012-09-07 MED ORDER — TETANUS-DIPHTH-ACELL PERTUSSIS 5-2.5-18.5 LF-MCG/0.5 IM SUSP
0.5000 mL | Freq: Once | INTRAMUSCULAR | Status: AC
  Administered 2012-09-08: 0.5 mL via INTRAMUSCULAR

## 2012-09-07 MED ORDER — PRENATAL MULTIVITAMIN CH
1.0000 | ORAL_TABLET | Freq: Every day | ORAL | Status: DC
  Administered 2012-09-09: 1 via ORAL
  Filled 2012-09-07 (×2): qty 1

## 2012-09-07 MED ORDER — CEFAZOLIN SODIUM-DEXTROSE 2-3 GM-% IV SOLR
INTRAVENOUS | Status: AC
  Filled 2012-09-07: qty 50

## 2012-09-07 MED ORDER — DIPHENHYDRAMINE HCL 50 MG/ML IJ SOLN: 25.0000 mg | INTRAMUSCULAR | Status: DC | PRN

## 2012-09-07 MED ORDER — KETOROLAC TROMETHAMINE 30 MG/ML IJ SOLN: 15.0000 mg | Freq: Once | INTRAMUSCULAR | Status: DC | PRN

## 2012-09-07 MED ORDER — SCOPOLAMINE 1 MG/3DAYS TD PT72
MEDICATED_PATCH | TRANSDERMAL | Status: AC
  Administered 2012-09-07: 1.5 mg via TRANSDERMAL
  Filled 2012-09-07: qty 1

## 2012-09-07 MED ORDER — MEPERIDINE HCL 25 MG/ML IJ SOLN: 6.2500 mg | INTRAMUSCULAR | Status: DC | PRN

## 2012-09-07 MED ORDER — DIPHENHYDRAMINE HCL 50 MG/ML IJ SOLN
12.5000 mg | INTRAMUSCULAR | Status: DC | PRN
  Administered 2012-09-07: 12.5 mg via INTRAVENOUS
  Filled 2012-09-07: qty 1

## 2012-09-07 MED ORDER — SIMETHICONE 80 MG PO CHEW: 80.0000 mg | CHEWABLE_TABLET | ORAL | Status: DC | PRN

## 2012-09-07 MED ORDER — ALBUTEROL SULFATE HFA 108 (90 BASE) MCG/ACT IN AERS: 2.0000 | INHALATION_SPRAY | RESPIRATORY_TRACT | Status: DC | PRN

## 2012-09-07 MED ORDER — DIPHENHYDRAMINE HCL 25 MG PO CAPS: 25.0000 mg | ORAL_CAPSULE | Freq: Four times a day (QID) | ORAL | Status: DC | PRN

## 2012-09-07 MED ORDER — LACTATED RINGERS IV SOLN
INTRAVENOUS | Status: DC
  Administered 2012-09-07 (×3): via INTRAVENOUS

## 2012-09-07 MED ORDER — OXYTOCIN 10 UNIT/ML IJ SOLN
40.0000 [IU] | INTRAVENOUS | Status: DC | PRN
  Administered 2012-09-07: 40 [IU] via INTRAVENOUS

## 2012-09-07 MED ORDER — ACETAMINOPHEN 10 MG/ML IV SOLN
1000.0000 mg | Freq: Four times a day (QID) | INTRAVENOUS | Status: AC | PRN
  Filled 2012-09-07: qty 100

## 2012-09-07 MED ORDER — NALOXONE HCL 0.4 MG/ML IJ SOLN: 0.4000 mg | INTRAMUSCULAR | Status: DC | PRN

## 2012-09-07 MED ORDER — OXYCODONE-ACETAMINOPHEN 5-325 MG PO TABS
1.0000 | ORAL_TABLET | ORAL | Status: DC | PRN
  Administered 2012-09-08 (×3): 2 via ORAL
  Administered 2012-09-08: 1 via ORAL
  Administered 2012-09-09 (×2): 2 via ORAL
  Filled 2012-09-07 (×4): qty 2
  Filled 2012-09-07: qty 1
  Filled 2012-09-07: qty 2

## 2012-09-07 MED ORDER — MEASLES, MUMPS & RUBELLA VAC ~~LOC~~ INJ: 0.5000 mL | INJECTION | Freq: Once | SUBCUTANEOUS | Status: DC

## 2012-09-07 MED ORDER — WITCH HAZEL-GLYCERIN EX PADS: 1.0000 "application " | MEDICATED_PAD | CUTANEOUS | Status: DC | PRN

## 2012-09-07 MED ORDER — MENTHOL 3 MG MT LOZG: 1.0000 | LOZENGE | OROMUCOSAL | Status: DC | PRN

## 2012-09-07 MED ORDER — OXYTOCIN 40 UNITS IN LACTATED RINGERS INFUSION - SIMPLE MED: 62.5000 mL/h | INTRAVENOUS | Status: AC

## 2012-09-07 MED ORDER — OXYTOCIN 10 UNIT/ML IJ SOLN
INTRAMUSCULAR | Status: AC
  Filled 2012-09-07: qty 4

## 2012-09-07 MED ORDER — FENTANYL CITRATE 0.05 MG/ML IJ SOLN: 25.0000 ug | INTRAMUSCULAR | Status: DC | PRN

## 2012-09-07 MED ORDER — LACTATED RINGERS IV SOLN
Freq: Once | INTRAVENOUS | Status: AC
  Administered 2012-09-07: 07:00:00 via INTRAVENOUS

## 2012-09-07 MED ORDER — LEVOTHYROXINE SODIUM 25 MCG PO TABS
25.0000 ug | ORAL_TABLET | Freq: Every day | ORAL | Status: DC
  Administered 2012-09-08: 225 ug via ORAL
  Administered 2012-09-09: 25 ug via ORAL
  Filled 2012-09-07 (×2): qty 1

## 2012-09-07 MED ORDER — PHENYLEPHRINE HCL 10 MG/ML IJ SOLN
INTRAMUSCULAR | Status: DC | PRN
  Administered 2012-09-07: 40 ug via INTRAVENOUS
  Administered 2012-09-07 (×2): 80 ug via INTRAVENOUS

## 2012-09-07 MED ORDER — BUPIVACAINE HCL (PF) 0.75 % IJ SOLN
INTRAMUSCULAR | Status: DC | PRN
  Administered 2012-09-07: 1.6 mL

## 2012-09-07 MED ORDER — NALBUPHINE HCL 10 MG/ML IJ SOLN
5.0000 mg | INTRAMUSCULAR | Status: DC | PRN
  Filled 2012-09-07 (×2): qty 1

## 2012-09-07 MED ORDER — SODIUM CHLORIDE 0.9 % IJ SOLN
INTRAMUSCULAR | Status: AC
  Administered 2012-09-07: 10 mL
  Filled 2012-09-07: qty 3

## 2012-09-07 MED ORDER — SODIUM CHLORIDE 0.9 % IJ SOLN: 3.0000 mL | INTRAMUSCULAR | Status: DC | PRN

## 2012-09-07 MED ORDER — BUPIVACAINE LIPOSOME 1.3 % IJ SUSP
20.0000 mL | Freq: Once | INTRAMUSCULAR | Status: DC
  Filled 2012-09-07: qty 20

## 2012-09-07 MED ORDER — ONDANSETRON HCL 4 MG/2ML IJ SOLN
INTRAMUSCULAR | Status: AC
  Filled 2012-09-07: qty 2

## 2012-09-07 MED ORDER — DIPHENHYDRAMINE HCL 50 MG/ML IJ SOLN
INTRAMUSCULAR | Status: AC
  Administered 2012-09-07: 25 mg via INTRAMUSCULAR
  Filled 2012-09-07: qty 1

## 2012-09-07 MED ORDER — MORPHINE SULFATE 0.5 MG/ML IJ SOLN
INTRAMUSCULAR | Status: AC
  Filled 2012-09-07: qty 10

## 2012-09-07 MED ORDER — SIMETHICONE 80 MG PO CHEW
80.0000 mg | CHEWABLE_TABLET | Freq: Three times a day (TID) | ORAL | Status: DC
  Administered 2012-09-07 – 2012-09-09 (×7): 80 mg via ORAL

## 2012-09-07 MED ORDER — BISACODYL 10 MG RE SUPP: 10.0000 mg | Freq: Every day | RECTAL | Status: DC | PRN

## 2012-09-07 MED ORDER — SENNOSIDES-DOCUSATE SODIUM 8.6-50 MG PO TABS
2.0000 | ORAL_TABLET | Freq: Every day | ORAL | Status: DC
  Administered 2012-09-07 – 2012-09-08 (×2): 2 via ORAL

## 2012-09-07 MED ORDER — ZOLPIDEM TARTRATE 5 MG PO TABS: 5.0000 mg | ORAL_TABLET | Freq: Every evening | ORAL | Status: DC | PRN

## 2012-09-07 MED ORDER — MORPHINE SULFATE (PF) 0.5 MG/ML IJ SOLN
INTRAMUSCULAR | Status: DC | PRN
  Administered 2012-09-07: .15 mg via INTRATHECAL

## 2012-09-07 MED ORDER — NALBUPHINE HCL 10 MG/ML IJ SOLN
5.0000 mg | INTRAMUSCULAR | Status: DC | PRN
  Administered 2012-09-07 (×2): 10 mg via INTRAVENOUS
  Filled 2012-09-07 (×2): qty 1

## 2012-09-07 MED ORDER — DIPHENHYDRAMINE HCL 25 MG PO CAPS
25.0000 mg | ORAL_CAPSULE | ORAL | Status: DC | PRN
  Administered 2012-09-07 – 2012-09-08 (×2): 25 mg via ORAL
  Filled 2012-09-07 (×2): qty 1

## 2012-09-07 MED ORDER — FENTANYL CITRATE 0.05 MG/ML IJ SOLN
INTRAMUSCULAR | Status: DC | PRN
  Administered 2012-09-07: 25 ug via INTRATHECAL

## 2012-09-07 MED ORDER — ONDANSETRON HCL 4 MG/2ML IJ SOLN: 4.0000 mg | INTRAMUSCULAR | Status: DC | PRN

## 2012-09-07 MED ORDER — FERROUS SULFATE 325 (65 FE) MG PO TABS
325.0000 mg | ORAL_TABLET | Freq: Two times a day (BID) | ORAL | Status: DC
  Administered 2012-09-07 – 2012-09-09 (×4): 325 mg via ORAL
  Filled 2012-09-07 (×4): qty 1

## 2012-09-07 SURGICAL SUPPLY — 36 items
CLAMP CORD UMBIL (MISCELLANEOUS) IMPLANT
CLOTH BEACON ORANGE TIMEOUT ST (SAFETY) ×3 IMPLANT
DERMABOND ADVANCED (GAUZE/BANDAGES/DRESSINGS) ×2
DERMABOND ADVANCED .7 DNX12 (GAUZE/BANDAGES/DRESSINGS) ×4 IMPLANT
DRAPE LG THREE QUARTER DISP (DRAPES) ×3 IMPLANT
DRSG OPSITE POSTOP 4X10 (GAUZE/BANDAGES/DRESSINGS) ×3 IMPLANT
DURAPREP 26ML APPLICATOR (WOUND CARE) ×6 IMPLANT
ELECT REM PT RETURN 9FT ADLT (ELECTROSURGICAL) ×3
ELECTRODE REM PT RTRN 9FT ADLT (ELECTROSURGICAL) ×2 IMPLANT
EXTRACTOR VACUUM BELL STYLE (SUCTIONS) IMPLANT
GLOVE BIOGEL PI IND STRL 8 (GLOVE) ×2 IMPLANT
GLOVE BIOGEL PI INDICATOR 8 (GLOVE) ×1
GLOVE ECLIPSE 8.0 STRL XLNG CF (GLOVE) ×3 IMPLANT
GOWN STRL REIN XL XLG (GOWN DISPOSABLE) ×6 IMPLANT
KIT ABG SYR 3ML LUER SLIP (SYRINGE) ×3 IMPLANT
NEEDLE HYPO 18GX1.5 BLUNT FILL (NEEDLE) ×3 IMPLANT
NEEDLE HYPO 22GX1.5 SAFETY (NEEDLE) ×6 IMPLANT
NEEDLE HYPO 25X5/8 SAFETYGLIDE (NEEDLE) ×3 IMPLANT
NS IRRIG 1000ML POUR BTL (IV SOLUTION) ×3 IMPLANT
PACK C SECTION WH (CUSTOM PROCEDURE TRAY) ×3 IMPLANT
PAD OB MATERNITY 4.3X12.25 (PERSONAL CARE ITEMS) ×3 IMPLANT
RTRCTR C-SECT PINK 25CM LRG (MISCELLANEOUS) IMPLANT
STAPLER VISISTAT 35W (STAPLE) IMPLANT
SUT CHROMIC 0 CT 1 (SUTURE) ×3 IMPLANT
SUT MNCRL 0 VIOLET CTX 36 (SUTURE) ×4 IMPLANT
SUT MONOCRYL 0 CTX 36 (SUTURE) ×2
SUT PLAIN 2 0 (SUTURE) ×1
SUT PLAIN 2 0 XLH (SUTURE) IMPLANT
SUT PLAIN ABS 2-0 CT1 27XMFL (SUTURE) ×2 IMPLANT
SUT VIC AB 0 CTX 36 (SUTURE) ×1
SUT VIC AB 0 CTX36XBRD ANBCTRL (SUTURE) ×2 IMPLANT
SUT VIC AB 4-0 KS 27 (SUTURE) ×3 IMPLANT
SYR 20CC LL (SYRINGE) ×6 IMPLANT
TOWEL OR 17X24 6PK STRL BLUE (TOWEL DISPOSABLE) ×9 IMPLANT
TRAY FOLEY CATH 14FR (SET/KITS/TRAYS/PACK) IMPLANT
WATER STERILE IRR 1000ML POUR (IV SOLUTION) ×3 IMPLANT

## 2012-09-07 NOTE — Anesthesia Procedure Notes (Signed)
Spinal  Patient location during procedure: OR Start time: 09/07/2012 7:56 AM Staffing Anesthesiologist: Angus Seller., Harrell Gave. Performed by: anesthesiologist  Preanesthetic Checklist Completed: patient identified, site marked, surgical consent, pre-op evaluation, timeout performed, IV checked, risks and benefits discussed and monitors and equipment checked Spinal Block Patient position: sitting Prep: DuraPrep Patient monitoring: heart rate, cardiac monitor, continuous pulse ox and blood pressure Approach: midline Location: L3-4 Injection technique: single-shot Needle Needle type: Sprotte  Needle gauge: 24 G Needle length: 9 cm Assessment Sensory level: T4 Additional Notes Patient identified.  Risk benefits discussed including failed block, incomplete pain control, headache, nerve damage, paralysis, blood pressure changes, nausea, vomiting, reactions to medication both toxic or allergic, and postpartum back pain.  Patient expressed understanding and wished to proceed.  All questions were answered.  Sterile technique used throughout procedure.  CSF was clear.  No parasthesia or other complications.  Please see nursing notes for vital signs.

## 2012-09-07 NOTE — Progress Notes (Signed)
Called into room by another RN, stating that the patient had accidentally pulled her IV out while trying to get the baby out of the bassinet.  Catheter intact.  Site not bleeding, 2x2 and tape applied to site.  Will discuss with midwife on call to see if IV site and fluids need to be restarted.

## 2012-09-07 NOTE — Progress Notes (Signed)
Spoke with MD on call (Dr. Thad Ranger), who agrees that her IV does not need to be restarted currently.  Will continue to encourage PO intake and monitor I&O's.

## 2012-09-07 NOTE — H&P (Signed)
Preoperative History and Physical  [name] is a 37 y.o. G3P1011 with Patient's last menstrual period was 12/01/2011. [redacted]w[redacted]d admitted for a repeat Caesarean section and tubal ligation.  Previous section declines TOL Class A2 diabetes well controlled  PMH:    Past Medical History  Diagnosis Date  . CP (cerebral palsy)     Left side affected  . Cyst of brain   . Hypothyroidism     On Synthroid  . Asthma     well controlled.Advair inhaler and Proventil prn  . Gestational diabetes     On Glyburide  . Fibromyalgia   . Depression     Zoloft   . 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 Cardiac surgery    . Nasal sinus surgery    . Cesarean section      POb/GynH:      OB History   Grav Para Term Preterm Abortions TAB SAB Ect Mult Living   3 1 1  1  1   1       SH:   History  Substance Use Topics  . Smoking status: Former Smoker    Types: Cigarettes  . Smokeless tobacco: Never Used  . Alcohol Use: No    FH:    Family History  Problem Relation Age of Onset  . Diabetes Other   . Hypertension Other   . Stroke Other   . Coronary artery disease Other      Allergies:  Allergies  Allergen Reactions  . Doxycycline Other (See Comments)    Unknown reaction  . Lisinopril Other (See Comments)    unkown  . Mirapex (Pramipexole Dihydrochloride) Hives  . Tetracyclines & Related     "made me feel like i was going crazy"    Medications:      Current facility-administered medications:bupivacaine liposome (EXPAREL) 1.3 % injection 266 mg, 20 mL, Infiltration, Once, Lazaro Arms, MD;  ceFAZolin (ANCEF) 2-3 GM-% IVPB SOLR, , , , ;  ceFAZolin (ANCEF) IVPB 2 g/50 mL premix, 2 g, Intravenous, On Call to OR, Lazaro Arms, MD;  lactated ringers infusion, , Intravenous, Once, Dana Allan, MD;  lactated ringers infusion, , Intravenous, Continuous, Dana Allan, MD scopolamine (TRANSDERM-SCOP) 1.5 MG 1.5 mg, 1 patch, Transdermal, Once, Dana Allan, MD;  scopolamine (TRANSDERM-SCOP) 1.5 MG, , , ,   Review of Systems:   Review of Systems  Constitutional: Negative for fever, chills, weight loss, malaise/fatigue and diaphoresis.  HENT: Negative for hearing loss, ear pain, nosebleeds, congestion, sore throat, neck pain, tinnitus and ear discharge.   Eyes: Negative for blurred vision, double vision, photophobia, pain, discharge and redness.  Respiratory: Negative for cough, hemoptysis, sputum production, shortness of breath, wheezing and stridor.   Cardiovascular: Negative for chest pain, palpitations, orthopnea, claudication, leg swelling and PND.  Gastrointestinal: Positive for abdominal pain. Negative for heartburn, nausea, vomiting, diarrhea, constipation, blood in stool and melena.  Genitourinary: Negative for dysuria, urgency, frequency, hematuria and flank pain.  Musculoskeletal: Negative for myalgias, back pain, joint pain and falls.  Skin: Negative for itching and rash.  Neurological: Negative for dizziness, tingling, tremors, sensory change, speech change, focal weakness, seizures, loss of consciousness, weakness and headaches.  Endo/Heme/Allergies: Negative for environmental allergies and polydipsia. Does not bruise/bleed easily.  Psychiatric/Behavioral: Negative for depression, suicidal ideas, hallucinations, memory loss and substance abuse.  The patient is not nervous/anxious and does not have insomnia.      PHYSICAL EXAM:  Blood pressure 129/83, pulse 91, temperature 99 F (37.2 C), temperature source Oral, resp. rate 16, last menstrual period 12/01/2011, SpO2 98.00%.    Vitals reviewed. Constitutional: She is oriented to person, place, and time. She appears well-developed and well-nourished.  HENT:  Head: Normocephalic and atraumatic.  Right Ear: External ear normal.  Left Ear: External ear normal.  Nose: Nose normal.   Mouth/Throat: Oropharynx is clear and moist.  Eyes: Conjunctivae and EOM are normal. Pupils are equal, round, and reactive to light. Right eye exhibits no discharge. Left eye exhibits no discharge. No scleral icterus.  Neck: Normal range of motion. Neck supple. No tracheal deviation present. No thyromegaly present.  Cardiovascular: Normal rate, regular rhythm, normal heart sounds and intact distal pulses.  Exam reveals no gallop and no friction rub.   No murmur heard. Respiratory: Effort normal and breath sounds normal. No respiratory distress. She has no wheezes. She has no rales. She exhibits no tenderness.  GI: Soft. Bowel sounds are normal. She exhibits no distension and no mass. There is tenderness. There is no rebound and no guarding.  Genitourinary:       Vulva is normal without lesions Vagina is pink moist without discharge Cervix normal in appearance and pap is normal Uterus is 42 cm Adnexa is negative with normal sized ovaries by sonogram  Musculoskeletal: Normal range of motion. She exhibits no edema and no tenderness.  Neurological: She is alert and oriented to person, place, and time. She has normal reflexes. She displays normal reflexes. No cranial nerve deficit. She exhibits normal muscle tone. Coordination normal.  Skin: Skin is warm and dry. No rash noted. No erythema. No pallor.  Psychiatric: She has a normal mood and affect. Her behavior is normal. Judgment and thought content normal.    Labs: Results for orders placed during the hospital encounter of 09/07/12 (from the past 336 hour(s))  GLUCOSE, CAPILLARY   Collection Time    09/07/12  6:39 AM      Result Value Range   Glucose-Capillary 76  70 - 99 mg/dL  Results for orders placed during the hospital encounter of 09/05/12 (from the past 336 hour(s))  CBC   Collection Time    09/05/12 12:00 PM      Result Value Range   WBC 11.1 (*) 4.0 - 10.5 K/uL   RBC 4.15  3.87 - 5.11 MIL/uL   Hemoglobin 12.2  12.0 - 15.0  g/dL   HCT 16.1  09.6 - 04.5 %   MCV 87.2  78.0 - 100.0 fL   MCH 29.4  26.0 - 34.0 pg   MCHC 33.7  30.0 - 36.0 g/dL   RDW 40.9  81.1 - 91.4 %   Platelets 227  150 - 400 K/uL  COMPREHENSIVE METABOLIC PANEL   Collection Time    09/05/12 12:00 PM      Result Value Range   Sodium 136  135 - 145 mEq/L   Potassium 3.9  3.5 - 5.1 mEq/L   Chloride 103  96 - 112 mEq/L   CO2 21  19 - 32 mEq/L   Glucose, Bld 76  70 - 99 mg/dL   BUN 9  6 - 23 mg/dL   Creatinine, Ser 7.82  0.50 - 1.10 mg/dL   Calcium 9.2  8.4 - 95.6 mg/dL   Total Protein 6.6  6.0 - 8.3 g/dL   Albumin 2.6 (*)  3.5 - 5.2 g/dL   AST 15  0 - 37 U/L   ALT 9  0 - 35 U/L   Alkaline Phosphatase 121 (*) 39 - 117 U/L   Total Bilirubin 0.4  0.3 - 1.2 mg/dL   GFR calc non Af Amer >90  >90 mL/min   GFR calc Af Amer >90  >90 mL/min  RPR   Collection Time    09/05/12 12:00 PM      Result Value Range   RPR NON REACTIVE  NON REACTIVE  TYPE AND SCREEN   Collection Time    09/05/12 12:00 PM      Result Value Range   ABO/RH(D) O POS     Antibody Screen NEG     Sample Expiration 09/08/2012    ABO/RH   Collection Time    09/05/12 12:00 PM      Result Value Range   ABO/RH(D) O POS    Results for orders placed in visit on 09/04/12 (from the past 336 hour(s))  POCT URINALYSIS DIPSTICK   Collection Time    09/04/12  1:42 PM      Result Value Range   Color, UA       Clarity, UA       Glucose, UA neg     Bilirubin, UA       Ketones, UA trace     Spec Grav, UA       Blood, UA neg     pH, UA       Protein, UA trace     Urobilinogen, UA       Nitrite, UA neg     Leukocytes, UA Negative    Results for orders placed in visit on 08/31/12 (from the past 336 hour(s))  POCT URINALYSIS DIPSTICK   Collection Time    08/31/12  1:44 PM      Result Value Range   Color, UA yellow     Clarity, UA clear     Glucose, UA neg     Bilirubin, UA       Ketones, UA neg     Spec Grav, UA       Blood, UA neg     pH, UA       Protein, UA trace      Urobilinogen, UA       Nitrite, UA neg     Leukocytes, UA small (1+)    Results for orders placed in visit on 08/28/12 (from the past 336 hour(s))  POCT URINALYSIS DIPSTICK   Collection Time    08/28/12  2:04 PM      Result Value Range   Color, UA clear     Clarity, UA       Glucose, UA neg     Bilirubin, UA       Ketones, UA neg     Spec Grav, UA       Blood, UA neg     pH, UA       Protein, UA trace     Urobilinogen, UA       Nitrite, UA neg     Leukocytes, UA Negative      EKG: No orders found for this or any previous visit.  Imaging Studies: US Ob Follow Up  08/21/2012    Spero Geralds is in the office for a follow up sonogram.  She is a 37 y.o. year old G33P1011 with Estimated Date of Delivery: 09/14/12  by early ultrasound,  midtrimester ultrasound now at  [redacted]w[redacted]d weeks  gestation. Thus far the pregnancy has been complicated by diabetes and  AMA, previous Cesarean section.         GESTATION: SINGLETON  PRESENTATION: cephalic  FETAL ACTIVITY:          Heart rate         153          The fetus is active.  AMNIOTIC FLUID: The amniotic fluid volume is  normal, 10.8 cm.  PLACENTA LOCALIZATION:  anterior GRADE 1  CERVIX: Measures na cm  ADNEXA: The ovaries are normal.   GESTATIONAL AGE AND  BIOMETRICS:  Gestational criteria: Estimated Date of Delivery: 09/14/12 by early  ultrasound, midtrimester ultrasound now at [redacted]w[redacted]d  Previous Scans:4  GESTATIONAL SAC            mm          weeks  CROWN RUMP LENGTH            mm          weeks  NUCHAL TRANSLUCENCY            mm           BIPARIETAL DIAMETER           9.57 cm         39+1 weeks  HEAD CIRCUMFERENCE           34.56 cm         40+0 weeks  ABDOMINAL CIRCUMFERENCE           34.19 cm         38+1 weeks  FEMUR LENGTH           7.17 cm         36+5 weeks                                                           AVERAGE EGA(BY THIS SCAN):   38+3 weeks                                                 ESTIMATED FETAL WEIGHT:        3300  grams, 74th %      ANATOMICAL SURVEY                                                                             COMMENTS CEREBRAL VENTRICLES yes normal   CHOROID PLEXUS yes normal   CEREBELLUM yes normal   CISTERNA MAGNA yes normal   NUCHAL REGION yes normal   ORBITS     NASAL BONE     NOSE/LIP     FACIAL PROFILE yes normal   4 CHAMBERED HEART     OUTFLOW TRACTS     DIAPHRAGM yes normal   STOMACH yes normal   RENAL REGION     BLADDER yes  normal   CORD INSERTION     3 VESSEL CORD yes normal   SPINE     ARMS/HANDS     LEGS/FEET     GENITALIA yes normal Female  Rylan        SUSPECTED ABNORMALITIES:  no  QUALITY OF SCAN: satisfactory    TECHNICIAN COMMENTS: Active fetus, BPP 8/8, fluid wnl, appropriate growth       BIOPHYSCIAL PROFILE:                                                                                                       COMMENTS GROSS BODY MOVEMENT                2   TONE                2   RESPIRATIONS                2   AMNIOTIC FLUID                2                                                            SCORE:  8/8 (Note: NST was not performed as part of this antepartum testing)       DOPPLER FLOW STUDIES: not clinically indicated UMBILICAL ARTERY RI RATIOS:       A copy of this report including all images has been saved and backed up to  a second source for retrieval if needed. All measures and details of the  anatomical scan, placentation, fluid volume and pelvic anatomy are  contained in that report.  Chari Manning 08/21/2012 1:56 PM  I have reviewed this scan per the above data.  Normal scan with reassuring fetal testing.  BPP reassuring, recommend  continue twice weekly testing or as clinically indicated.  Lazaro Arms 08/21/2012 2:19 PM                                              US Fetal Bpp W/o Non Stress  08/21/2012    Spero Geralds is in the office for a follow up sonogram.  She is a 37 y.o. year old G80P1011 with Estimated Date of Delivery: 09/14/12  by early ultrasound, midtrimester ultrasound now  at  [redacted]w[redacted]d weeks  gestation. Thus far the pregnancy has been complicated by diabetes and  AMA, previous Cesarean section.         GESTATION: SINGLETON  PRESENTATION: cephalic  FETAL ACTIVITY:          Heart rate         153          The fetus is active.  AMNIOTIC FLUID: The  amniotic fluid volume is  normal, 10.8 cm.  PLACENTA LOCALIZATION:  anterior GRADE 1  CERVIX: Measures na cm  ADNEXA: The ovaries are normal.   GESTATIONAL AGE AND  BIOMETRICS:  Gestational criteria: Estimated Date of Delivery: 09/14/12 by early  ultrasound, midtrimester ultrasound now at [redacted]w[redacted]d  Previous Scans:4  GESTATIONAL SAC            mm          weeks  CROWN RUMP LENGTH            mm          weeks  NUCHAL TRANSLUCENCY            mm           BIPARIETAL DIAMETER           9.57 cm         39+1 weeks  HEAD CIRCUMFERENCE           34.56 cm         40+0 weeks  ABDOMINAL CIRCUMFERENCE           34.19 cm         38+1 weeks  FEMUR LENGTH           7.17 cm         36+5 weeks                                                           AVERAGE EGA(BY THIS SCAN):   38+3 weeks                                                 ESTIMATED FETAL WEIGHT:        3300  grams, 74th %     ANATOMICAL SURVEY                                                                             COMMENTS CEREBRAL VENTRICLES yes normal   CHOROID PLEXUS yes normal   CEREBELLUM yes normal   CISTERNA MAGNA yes normal   NUCHAL REGION yes normal   ORBITS     NASAL BONE     NOSE/LIP     FACIAL PROFILE yes normal   4 CHAMBERED HEART     OUTFLOW TRACTS     DIAPHRAGM yes normal   STOMACH yes normal   RENAL REGION     BLADDER yes normal   CORD INSERTION     3 VESSEL CORD yes normal   SPINE     ARMS/HANDS     LEGS/FEET     GENITALIA yes normal Female  Rylan        SUSPECTED ABNORMALITIES:  no  QUALITY OF SCAN: satisfactory    TECHNICIAN COMMENTS: Active fetus, BPP 8/8, fluid wnl, appropriate growth       BIOPHYSCIAL PROFILE:  COMMENTS GROSS BODY MOVEMENT                2   TONE                2   RESPIRATIONS                2   AMNIOTIC FLUID                2                                                            SCORE:  8/8 (Note: NST was not performed as part of this antepartum testing)       DOPPLER FLOW STUDIES: not clinically indicated UMBILICAL ARTERY RI RATIOS:       A copy of this report including all images has been saved and backed up to  a second source for retrieval if needed. All measures and details of the  anatomical scan, placentation, fluid volume and pelvic anatomy are  contained in that report.  Chari Manning 08/21/2012 1:56 PM  I have reviewed this scan per the above data.  Normal scan with reassuring fetal testing.  BPP reassuring, recommend  continue twice weekly testing or as clinically indicated.  Lazaro Arms 08/21/2012 2:19 PM                                                 Assessment: [redacted]w[redacted]d G3P1011 Class A2 diabetes Desires sterilization Patient Active Problem List   Diagnosis Date Noted  . Abnormal maternal glucose tolerance, antepartum 09/04/2012  . Advanced maternal age (AMA) in pregnancy 08/21/2012  . Gestational diabetes mellitus, class A2 06/28/2012  . High-risk pregnancy supervision 06/28/2012  . Previous cesarean section 06/12/2012  . 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 Asthma 05/09/2012  . CP (cerebral palsy) left side affected 05/09/2012  . Fibromyalgia 05/09/2012  . Hypothyroid 05/09/2012  . Cyst of brain 05/09/2012    Plan: Repeat Caesarean section, bilateral tubal ligation  EURE,LUTHER H 09/07/2012 7:00 AM

## 2012-09-07 NOTE — Anesthesia Postprocedure Evaluation (Signed)
  Anesthesia Post-op Note  Anesthesia Post Note  Patient: [name]  Procedure(s) Performed: Procedure(s) (LRB): CESAREAN SECTION WITH BILATERAL TUBAL LIGATION (Bilateral)  Anesthesia type: Spinal  Patient location: PACU  Post pain: Pain level controlled  Post assessment: Post-op Vital signs reviewed  Last Vitals:  Filed Vitals:   09/07/12 1015  BP: 110/59  Pulse: 75  Temp:   Resp: 17    Post vital signs: Reviewed  Level of consciousness: awake  Complications: No apparent anesthesia complications

## 2012-09-07 NOTE — Progress Notes (Signed)
UR chart review completed.  

## 2012-09-07 NOTE — Op Note (Signed)
[name]   PROCEDURE DATE: 09/07/2012  PREOPERATIVE DIAGNOSIS: Intrauterine pregnancy at  [redacted]w[redacted]d weeks gestation; previous cesarean section x 1, declined TOLAC, A2 Gestational Diabetes, Undesired fertility  POSTOPERATIVE DIAGNOSIS: The same  PROCEDURE: Repeat Low Transverse Cesarean Section and Bilateral Tubal Ligation  SURGEON:  Duane Lope, MD  ASSISTANT:  Napoleon Form, MD  INDICATIONS: [name] is a 37 y.o. [id number] at [redacted]w[redacted]d here for cesarean section secondary to the indications listed under preoperative diagnosis; please see preoperative note for further details.  The risks of cesarean section were discussed with the patient including but were not limited to: bleeding which may require transfusion or reoperation; infection which may require antibiotics; injury to bowel, bladder, ureters or other surrounding organs; injury to the fetus; need for additional procedures including hysterectomy in the event of a life-threatening hemorrhage; placental abnormalities wth subsequent pregnancies, incisional problems, thromboembolic phenomenon and other postoperative/anesthesia complications.  Patient also desires permanent sterilization.  Other reversible forms of contraception were discussed with patient; she declines all other modalities. Risks of procedure discussed with patient including but not limited to: risk of regret, permanence of method, bleeding, infection, injury to surrounding organs and need for additional procedures.  Failure risk of 0.5-1% with increased risk of ectopic gestation if pregnancy occurs was also discussed with patient.  The patient concurred with the proposed plan, giving informed written consent for the procedures.     FINDINGS:  Viable female infant in cephalic presentation.  Apgars 9 and 9.  Weight 8 lb, 8.2 oz.  Clear amniotic fluid.  Intact placenta, three vessel cord.  Normal uterus, fallopian tubes and ovaries bilaterally.  ANESTHESIA: Spinal INTRAVENOUS  FLUIDS: 2400 ml ESTIMATED BLOOD LOSS: 700 ml URINE OUTPUT:  75 ml SPECIMENS: Placenta sent to L&D COMPLICATIONS: None immediate  PROCEDURE IN DETAIL:  The patient preoperatively received intravenous antibiotics and had sequential compression devices applied to her lower extremities.  She was then taken to the operating room where spinal anesthesia was administered and was found to be adequate. She was then placed in a dorsal supine position with a leftward tilt, and prepped and draped in a sterile manner.  A foley catheter was placed into her bladder and attached to constant gravity.  After an adequate timeout was performed, a Pfannenstiel skin incision was made with scalpel and carried through to the underlying layer of fascia with electrocautery. The fascia was incised in the midline, and this incision was extended bilaterally using the Mayo scissors.  Kocher clamps were applied to the superior aspect of the fascial incision and the underlying rectus muscles were dissected off bluntly. A similar process was carried out on the inferior aspect of the fascial incision. The rectus muscles were separated in the midline bluntly and the peritoneum was entered bluntly. A bladder blade was placed. Attention was turned to the lower uterine segment where a low transverse hysterotomy incision was made with a scalpel and extended bilaterally bluntly.  Soft-cup vacuum was needed to deliver the fetal head; pressure was maintained in the green zone, and head was delivered with one continuous pull and fundal pressure with no pop-offs. The vacuum was disengaged once the head was delivered, and the shoulder and body were delivered without problem. The cord was clamped and cut and the infant was handed over to awaiting neonatology team. Uterine massage was then administered, and the placenta delivered intact with a three-vessel cord. The uterus was exteriorized and cleared of clot and debris.  The hysterotomy was closed with  0  Monocryl in a running locked fashion in a single layer. Attention was then turned to the Fallopian tubes. A modified Pomeroy technique was used. The right tube was identified and grasped with a Babcock clamp forming a 2 cm knuckle of tube. 2-0 Plain suture was passed through the mesosalpinx and tied on both sides of the tube. The knuckle was then excised with scissors and sent to pathology. The cut edges of the fallopian tube were observed for hemostasis. The same process was completed on the left fallopian tube. Hemostasis was obtained using electrocautery.  The uterus was carefully returned to the pelvis. The pelvis was cleared of all clot and debris. Hemostasis was confirmed on all surfaces. The peritoneum and the muscles were reapproximated using 2-0 Chromic in 2 interrupted stitches. The fascia was then closed using 0 Vicryl in a running fashion.  The subcutaneous layer was irrigated, then reapproximated with 2-0 plain suture in interrupted stitches, and the skin was closed with a 4-0 Vicryl subcuticular stitch. 60 ml of a mixture of 20 ml of liposomal Bupivacaine and 40 ml of sterile saline was injected into the subcutaneous tissue prior to closure. The patient tolerated the procedure well. Sponge, lap, instrument and needle counts were correct x 2.  She was taken to the recovery room in stable condition.   Napoleon Form, MD 09/07/2012 1:32 PM

## 2012-09-07 NOTE — Transfer of Care (Signed)
Immediate Anesthesia Transfer of Care Note  Patient: [name]  Procedure(s) Performed: Procedure(s) with comments: CESAREAN SECTION WITH BILATERAL TUBAL LIGATION (Bilateral) - Repeat   Patient Location: PACU  Anesthesia Type:Spinal  Level of Consciousness: awake  Airway & Oxygen Therapy: Patient Spontanous Breathing  Post-op Assessment: Report given to PACU RN and Post -op Vital signs reviewed and stable  Post vital signs: stable  Complications: No apparent anesthesia complications

## 2012-09-08 ENCOUNTER — Encounter (HOSPITAL_COMMUNITY): Payer: Self-pay | Admitting: Obstetrics & Gynecology

## 2012-09-08 LAB — CBC
HCT: 30.4 % — ABNORMAL LOW (ref 36.0–46.0)
MCH: 29.7 pg (ref 26.0–34.0)
MCV: 87.6 fL (ref 78.0–100.0)
Platelets: 184 10*3/uL (ref 150–400)
RDW: 14 % (ref 11.5–15.5)
WBC: 13 10*3/uL — ABNORMAL HIGH (ref 4.0–10.5)

## 2012-09-08 LAB — URINE CULTURE: Colony Count: 55000

## 2012-09-08 NOTE — Progress Notes (Signed)
Subjective: Postpartum Day 1: Cesarean Delivery Patient reports incisional pain, tolerating PO and + flatus.  Up and out of bed without problems.  Objective: Vital signs in last 24 hours: Temp:  [97.4 F (36.3 C)-99.5 F (37.5 C)] 98.1 F (36.7 C) (07/11 0454) Pulse Rate:  [60-114] 92 (07/11 0454) Resp:  [15-44] 20 (07/11 0454) BP: (93-122)/(45-79) 122/79 mmHg (07/11 0454) SpO2:  [91 %-100 %] 98 % (07/11 0454) Weight:  [107.049 kg (236 lb)] 107.049 kg (236 lb) (07/10 1100)  Physical Exam:  General: alert and no distress Lochia: appropriate Uterine Fundus: firm Incision: no significant drainage, bandage dry DVT Evaluation: No evidence of DVT seen on physical exam.   Recent Labs  09/05/12 1200 09/08/12 0610  HGB 12.2 10.3*  HCT 36.2 30.4*    Assessment/Plan: Status post Cesarean section. Doing well postoperatively.  Continue current care. Bottle feeding Birth control: BTL done yesterday [facility] Plan on discharge for tomorrow  Tawni Carnes 09/08/2012, 7:30 AM

## 2012-09-08 NOTE — Progress Notes (Signed)
I have seen and examined this patient and I agree with the above. Cam Hai 7:40 AM 09/08/2012

## 2012-09-09 MED ORDER — IBUPROFEN 600 MG PO TABS: 600.0000 mg | ORAL_TABLET | Freq: Four times a day (QID) | ORAL | Status: DC

## 2012-09-09 MED ORDER — OXYCODONE-ACETAMINOPHEN 5-325 MG PO TABS: 1.0000 | ORAL_TABLET | ORAL | Status: DC | PRN

## 2012-09-09 NOTE — Discharge Summary (Signed)
Obstetric Discharge Summary Reason for Admission: cesarean section, repeat with undesired fertility; A2 GDM Prenatal Procedures: none Intrapartum Procedures: cesarean: low cervical, transverse and tubal ligation Postpartum Procedures: none Complications-Operative and Postpartum: none Hemoglobin  Date Value Range Status  09/08/2012 10.3* 12.0 - 15.0 g/dL Final     HCT  Date Value Range Status  09/08/2012 30.4* 36.0 - 46.0 % Final    Physical Exam:  General: alert, cooperative and mild distress Heart: RRR Lungs: nl effort Lochia: appropriate Uterine Fundus: firm Incision: healing well, no significant drainage; honeycomb intact DVT Evaluation: No evidence of DVT seen on physical exam.  Discharge Diagnoses: Term Pregnancy-delivered  Discharge Information: Date: 09/09/2012 Activity: pelvic rest Diet: routine Medications: PNV, Ibuprofen and Percocet; resume meds for asthma, synthroid, Zoloft Condition: stable Instructions: refer to practice specific booklet Discharge to: home Follow-up Information   Follow up with [facility]. (Keep postpartum appointment as scheduled)    Contact information:   [address] [place] [address] [phone]      Newborn Data: Live born female  Birth Weight: 8 lb 8.2 oz (3860 g) APGAR: 9,   Home with mother. Bottlefeeding.  Cam Hai 09/09/2012, 7:28 AM

## 2012-09-09 NOTE — Discharge Summary (Signed)
Attestation of Attending Supervision of Advanced Practitioner (PA/CNM/NP): Evaluation and management procedures were performed by the Advanced Practitioner under my supervision and collaboration.  I have reviewed the Advanced Practitioner's note and chart, and I agree with the management and plan.  Ibraham Levi, MD, FACOG Attending Obstetrician & Gynecologist Faculty Practice, [facility]  

## 2012-09-14 ENCOUNTER — Ambulatory Visit (INDEPENDENT_AMBULATORY_CARE_PROVIDER_SITE_OTHER): Payer: Medicaid Other | Admitting: Obstetrics & Gynecology

## 2012-09-14 ENCOUNTER — Encounter: Payer: Self-pay | Admitting: Obstetrics & Gynecology

## 2012-09-14 VITALS — BP 110/80 | Ht 69.0 in | Wt 219.5 lb

## 2012-09-14 DIAGNOSIS — J019 Acute sinusitis, unspecified: Secondary | ICD-10-CM

## 2012-09-14 DIAGNOSIS — Z9889 Other specified postprocedural states: Secondary | ICD-10-CM

## 2012-09-14 DIAGNOSIS — M545 Low back pain: Secondary | ICD-10-CM

## 2012-09-14 NOTE — Patient Instructions (Addendum)
Incision Care  An incision is when a surgeon cuts into your body tissues. After surgery, the incision needs to be cared for properly to prevent infection.   HOME CARE INSTRUCTIONS    Take all medicine as directed by your caregiver. Only take over-the-counter or prescription medicines for pain, discomfort, or fever as directed by your caregiver.   Do not remove your bandage (dressing) or get your incision wet until your surgeon gives you permission. In the event that your dressing becomes wet, dirty, or starts to smell, change the dressing and call your surgeon for instructions as soon as possible.   Take showers. Do not take tub baths, swim, or do anything that may soak the wound until it is healed.   Resume your normal diet and activities as directed or allowed.   Avoid lifting any weight until you are instructed otherwise.   Use anti-itch antihistamine medicine as directed by your caregiver. The wound may itch when it is healing. Do not pick or scratch at the wound.   Follow up with your caregiver for stitch (suture) or staple removal as directed.   Drink enough fluids to keep your urine clear or pale yellow.  SEEK MEDICAL CARE IF:    You have redness, swelling, or increasing pain in the wound that is not controlled with medicine.   You have drainage, blood, or pus coming from the wound that lasts longer than 1 day.   You develop muscle aches, chills, or a general ill feeling.   You notice a bad smell coming from the wound or dressing.   Your wound edges separate after the sutures, staples, or skin adhesive strips have been removed.   You develop persistent nausea or vomiting.  SEEK IMMEDIATE MEDICAL CARE IF:    You have a fever.   You develop a rash.   You develop dizzy episodes or faint while standing.   You have difficulty breathing.   You develop any reaction or side effects to medicine given.  MAKE SURE YOU:    Understand these instructions.   Will watch your condition.   Will get help  right away if you are not doing well or get worse.  Document Released: 09/04/2004 Document Revised: 05/10/2011 Document Reviewed: 06/21/2010  ExitCare Patient Information 2014 [employer].

## 2012-09-14 NOTE — Progress Notes (Signed)
Patient ID: [name], female   DOB: 08/09/1975, 37 y.o.   MRN: [id number] Incision clean dry intact No complaints Doing well Baby is doing well

## 2012-09-25 ENCOUNTER — Ambulatory Visit: Payer: Medicaid Other | Admitting: Adult Health

## 2012-09-25 ENCOUNTER — Telehealth: Payer: Self-pay | Admitting: *Deleted

## 2012-09-25 NOTE — Telephone Encounter (Signed)
Clydie Braun from the [facility] Department called stating pt scored 12 on Edinburgh Depression Scale. Pt delivered on 09/07/12 per Clydie Braun. Pt has HX of depression, has not been taking adderol, Zoloft, and xanax. Pt states having no thoughts of harming herself or others. Appt made for this week to followup with provider.

## 2012-09-29 ENCOUNTER — Encounter: Payer: Self-pay | Admitting: Adult Health

## 2012-09-29 ENCOUNTER — Ambulatory Visit (INDEPENDENT_AMBULATORY_CARE_PROVIDER_SITE_OTHER): Payer: Medicaid Other | Admitting: Adult Health

## 2012-09-29 VITALS — BP 122/84 | Ht 69.0 in | Wt 216.0 lb

## 2012-09-29 DIAGNOSIS — F53 Postpartum depression: Secondary | ICD-10-CM

## 2012-09-29 DIAGNOSIS — Z8659 Personal history of other mental and behavioral disorders: Secondary | ICD-10-CM | POA: Insufficient documentation

## 2012-09-29 DIAGNOSIS — O99345 Other mental disorders complicating the puerperium: Secondary | ICD-10-CM | POA: Insufficient documentation

## 2012-09-29 HISTORY — DX: Postpartum depression: F53.0

## 2012-09-29 NOTE — Patient Instructions (Addendum)

## 2012-09-29 NOTE — Assessment & Plan Note (Signed)
Has zoloft and takes most days, try to take daily

## 2012-09-29 NOTE — Progress Notes (Signed)
Subjective:     Patient ID: [name], female   DOB: 1976/02/23, 37 y.o.   MRN: [id number]  HPI [name] is a 37 year old white female in today because her EPDS score was 12 when the [facility] did it this week,she says she is teary at times, but has no thoughts of hurting her self or the baby.She has a history of depression and has zoloft but admits to not taking it daily as prescribed. She had a C-section and BTL 09/07/12.  Review of Systems Positives in HPI Reviewed past medical,surgical, social and family history. Reviewed medications and allergies.      Objective:   Physical Exam BP 122/84  Ht 5\' 9"  (1.753 m)  Wt 216 lb (97.977 kg)  BMI 31.88 kg/m2   Her EPDS score today was 8.See HPI  Assessment:     Mild postpartum depression History of depression    Plan:     Take Zoloft 50 mg 1 daily and if it is not working then it can be increased  Return 8/15 for postpartum check Review handout on postpartum depression  And baby blues   Try to increase rest and call if needed before appt.

## 2012-10-13 ENCOUNTER — Encounter: Payer: Self-pay | Admitting: Adult Health

## 2012-10-13 ENCOUNTER — Ambulatory Visit (INDEPENDENT_AMBULATORY_CARE_PROVIDER_SITE_OTHER): Payer: Medicaid Other | Admitting: Adult Health

## 2012-10-13 NOTE — Progress Notes (Signed)
Patient ID: [name], female   DOB: 1975/07/04, 37 y.o.   MRN: [id number] [name] is a 37 year old white female in for postpartum visit. Delivery Date:09/07/12 Method of Delivery: C-section and BTL  Sexual Activity since delivery: no  Method of Feeding: bottle  Number of weeks bleeding post delivery: 3-4  Review of Systems: Patient denies any headaches, blurred vision, shortness of breath, chest pain, abdominal pain, problems with bowel movements, urination, or intercourse. No sex yet, no joint pains, she is on zoloft 100 mg now per Dr Sudie Bailey and adderall. Reviewed past medical,surgical, social and family history. Reviewed medications and allergies.  Depression Score: 8 BP 140/80  Ht 5\' 9"  (1.753 m)  Wt 213 lb (96.616 kg)  BMI 31.44 kg/m2  Breastfeeding? No  Pelvic Exam:   External genitalia is normal in appearance.  The vagina is normal in appearance. The cervix is bulbous.  Uterus is felt to be normal size, shape, and contour, well involuted.  No adnexal masses or tenderness noted.C-section scar has edges together, no swelling or tenderness noted.   Impression:  Status post delivery, post partum check, depression screening   Plan:   Physical and pap in 1 year Call prn problems

## 2012-10-13 NOTE — Patient Instructions (Addendum)
Follow up in 1 year,prn problems

## 2013-12-31 ENCOUNTER — Encounter: Payer: Self-pay | Admitting: Adult Health

## 2015-11-14 ENCOUNTER — Telehealth: Payer: Self-pay | Admitting: "Endocrinology

## 2015-11-16 ENCOUNTER — Telehealth: Payer: Self-pay | Admitting: "Endocrinology

## 2015-11-16 NOTE — Telephone Encounter (Deleted)
Received telephone call from mom 1. Overall status: She is doing well. 2. New problems: None 3. Tresiba dose: 35 4. Rapid-acting insulin: Novolog 120/30/10 plan, with +2 units at meals 5. BG log: 2 AM, Breakfast, Lunch, Supper, Bedtime 11/16/15: xxx, 300, 410, 355, pending - Total daily Novolog dose thus far today = 36 units 6. Assessment: She needs more insulin. 7. Plan: Increase the Tresiba dose to 40 units as of tonight. Continue the current Novolog plan.  8. FU call: tomorrow evening David StallBRENNAN,MICHAEL J

## 2015-11-18 ENCOUNTER — Telehealth: Payer: Self-pay | Admitting: "Endocrinology

## 2015-11-18 NOTE — Telephone Encounter (Deleted)
Received telephone call from mom 1. Overall status: Things are going OK. She is alternating sites. 2. New problems: None 3. Tresiba dose: 44 units 4. Rapid-acting insulin: Novo log 120/30/10 plan, with +2 units at each meal 5. BG log: 2 AM, Breakfast, Lunch, Supper, Bedtime 11/18/15: xxx, 370, 320, 391, pending 6. Assessment: I do not understand why her BGs are not significantly improving. She may be eating more. We will see what happens when we increase her Tresiba dose further. 7. Plan: Increase the Tresiba dose to 50 units. 8. FU call: tomorrow evening David StallBRENNAN,Brooke Payes J

## 2015-11-19 NOTE — Telephone Encounter (Signed)
Lost contact with this note when attempting to look at former note.

## 2015-12-21 ENCOUNTER — Telehealth (INDEPENDENT_AMBULATORY_CARE_PROVIDER_SITE_OTHER): Payer: Self-pay | Admitting: "Endocrinology

## 2015-12-30 NOTE — Telephone Encounter (Signed)
Chart opened in error

## 2016-02-02 ENCOUNTER — Encounter (HOSPITAL_COMMUNITY): Payer: Self-pay | Admitting: Emergency Medicine

## 2016-02-02 ENCOUNTER — Emergency Department (HOSPITAL_COMMUNITY)
Admission: EM | Admit: 2016-02-02 | Discharge: 2016-02-02 | Disposition: A | Discharge status: Home / Self Care | Payer: Medicaid Other | Attending: Emergency Medicine | Admitting: Emergency Medicine

## 2016-02-02 DIAGNOSIS — Y999 Unspecified external cause status: Secondary | ICD-10-CM | POA: Insufficient documentation

## 2016-02-02 DIAGNOSIS — Y929 Unspecified place or not applicable: Secondary | ICD-10-CM | POA: Diagnosis not present

## 2016-02-02 DIAGNOSIS — Z791 Long term (current) use of non-steroidal anti-inflammatories (NSAID): Secondary | ICD-10-CM | POA: Diagnosis not present

## 2016-02-02 DIAGNOSIS — Z87891 Personal history of nicotine dependence: Secondary | ICD-10-CM | POA: Insufficient documentation

## 2016-02-02 DIAGNOSIS — Z79899 Other long term (current) drug therapy: Secondary | ICD-10-CM | POA: Diagnosis not present

## 2016-02-02 DIAGNOSIS — S50861A Insect bite (nonvenomous) of right forearm, initial encounter: Secondary | ICD-10-CM | POA: Diagnosis present

## 2016-02-02 DIAGNOSIS — E039 Hypothyroidism, unspecified: Secondary | ICD-10-CM | POA: Insufficient documentation

## 2016-02-02 DIAGNOSIS — W57XXXA Bitten or stung by nonvenomous insect and other nonvenomous arthropods, initial encounter: Secondary | ICD-10-CM | POA: Diagnosis not present

## 2016-02-02 DIAGNOSIS — Y93E2 Activity, laundry: Secondary | ICD-10-CM | POA: Diagnosis not present

## 2016-02-02 DIAGNOSIS — J45909 Unspecified asthma, uncomplicated: Secondary | ICD-10-CM | POA: Diagnosis not present

## 2016-02-02 MED ORDER — FAMOTIDINE 20 MG PO TABS
20.0000 mg | ORAL_TABLET | Freq: Once | ORAL | Status: AC
  Administered 2016-02-02: 20 mg via ORAL
  Filled 2016-02-02: qty 1

## 2016-02-02 MED ORDER — DEXAMETHASONE SODIUM PHOSPHATE 4 MG/ML IJ SOLN
8.0000 mg | Freq: Once | INTRAMUSCULAR | Status: AC
  Administered 2016-02-02: 8 mg via INTRAMUSCULAR
  Filled 2016-02-02: qty 2

## 2016-02-02 NOTE — ED Provider Notes (Signed)
[facility]EMER[facility]Y DEPT Provider Note   CSN: [id number] Arrival date & time: 02/02/16  1443  By signing my name below, I, Placido SouLogan Joldersma, attest that this documentation has been prepared under the direction and in the presence of Ivery QualeHobson Jose Alleyne, PA-C. Electronically Signed: Placido SouLogan Joldersma, ED Scribe. 02/02/16. 7:22 PM.   History   Chief Complaint Chief Complaint  Patient presents with  . Insect Bite    HPI HPI Comments: [name] is a 40 y.o. female who presents to the Emergency Department complaining of a point of pain and redness to her right forearm onset ~6 hours ago. [name] states [name] was doing laundry at the time and felt "a sting" and then mild itchiness across the region which [name] believes is due to an insect bite although pt denies having visualized an insect at the time. Pt took a benadryl s/p the onset of her symptoms and states that [name] "felt funny" including "tongue and lip tingling". [name] decided to come to the ED for evaluation. Pt denies any known allergies to specific insects. Pt denies any hives, SOB, LOC, palpitations or other associated symptoms at this time.    The history is provided by the patient. No language interpreter was used.    Past Medical History:  Diagnosis Date  . Asthma    well controlled.Advair inhaler and Proventil prn  . 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 CP (cerebral palsy) (HCC)    Left side affected  . Cyst of brain   . Depression    Zoloft   . Fibromyalgia   . Gestational diabetes    On Glyburide  . Hypothyroidism    On Synthroid  . Postpartum depression 09/29/2012    Patient Active Problem List   Diagnosis Date Noted  . History of depression 09/29/2012  . Postpartum depression 09/29/2012  . Abnormal maternal glucose tolerance, antepartum 09/04/2012  . Advanced maternal age (AMA) in pregnancy 08/21/2012  . Gestational diabetes mellitus, class A2 06/28/2012  .  High-risk pregnancy supervision 06/28/2012  . Previous cesarean section 06/12/2012  . 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 Asthma 05/09/2012  . CP (cerebral palsy) left side affected 05/09/2012  . Fibromyalgia 05/09/2012  . Hypothyroid 05/09/2012  . 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 CARDIAC SURGERY    . CESAREAN SECTION    . CESAREAN SECTION WITH BILATERAL TUBAL LIGATION Bilateral 09/07/2012   Procedure: CESAREAN SECTION WITH BILATERAL TUBAL LIGATION;  Surgeon: Lazaro ArmsLuther H Eure, MD;  Location: [facility];  Service: Obstetrics;  Laterality: Bilateral;  Repeat   . NASAL SINUS SURGERY      OB History    Gravida Para Term Preterm AB Living   3 2 2   1 2    SAB TAB Ectopic Multiple Live Births   1       2       Home Medications    Prior to Admission medications   Medication Sig Start Date End Date Taking? Authorizing Provider  albuterol (PROVENTIL HFA;VENTOLIN HFA) 108 (90 BASE) MCG/ACT inhaler Inhale 2 puffs into the lungs every 4 (four) hours as needed for wheezing or shortness of breath.     Historical Provider, MD  albuterol (PROVENTIL) (2.5 MG/3ML) 0.083% nebulizer solution Take 5 mg by nebulization every 4 (four) hours as needed for wheezing or shortness of breath.     Historical Provider, MD  amphetamine-dextroamphetamine (  ADDERALL XR) 25 MG 24 hr capsule Take 25 mg by mouth every morning.    Historical Provider, MD  Fluticasone-Salmeterol (ADVAIR) 500-50 MCG/DOSE AEPB Inhale 1 puff into the lungs every 12 (twelve) hours.    Historical Provider, MD  ibuprofen (ADVIL,MOTRIN) 600 MG tablet Take 1 tablet (600 mg total) by mouth every 6 (six) hours. 09/09/12   Arabella Merles, CNM  levothyroxine (SYNTHROID) 200 MCG tablet Take 200 mcg by mouth daily. **Takes with tablet for a total of daily*    Historical Provider, MD  levothyroxine (SYNTHROID, LEVOTHROID) 25 MCG tablet Take 25 mcg by mouth daily. Taken with tablet  for a total of daily    Historical Provider, MD  oxyCODONE-acetaminophen (PERCOCET/ROXICET) 5-325 MG per tablet Take 1-2 tablets by mouth every 4 (four) hours as needed. 09/09/12   Arabella Merles, CNM  Prenatal Vit-Fe Fumarate-FA (PRENATAL MULTIVITAMIN) TABS Take 1 tablet by mouth daily.    Historical Provider, MD  sertraline (ZOLOFT) 50 MG tablet Take 100 mg by mouth daily. 06/28/12   Lazaro Arms, MD    Family History Family History  Problem Relation Age of Onset  . Hypertension Mother   . COPD Mother   . Diabetes Father   . Diabetes Paternal Grandmother     Social History Social History  Substance Use Topics  . Smoking status: Former Smoker    Types: Cigarettes  . Smokeless tobacco: Never Used  . Alcohol use No     Allergies   Doxycycline; Lisinopril; Mirapex [pramipexole dihydrochloride]; and Tetracyclines & related  Review of Systems Review of Systems  Respiratory: Negative for shortness of breath.   Cardiovascular: Negative for palpitations.  Musculoskeletal: Positive for myalgias.  Skin: Positive for color change. Negative for rash.  Neurological: Negative for syncope.  All other systems reviewed and are negative.  Physical Exam Updated Vital Signs BP 160/98 (BP Location: Right Arm)   Pulse 106   Temp 98.1 F (36.7 C) (Oral)   Resp 16   Ht 5\' 9"  (1.753 m)   Wt 200 lb (90.7 kg)   LMP 01/30/2016   SpO2 100%   BMI 29.53 kg/m   Physical Exam  Constitutional: [name] is oriented to person, place, and time. [name] appears well-developed and well-nourished.  HENT:  Head: Normocephalic and atraumatic.  Mouth/Throat: Uvula is midline.  No swelling of the lips. Airway patent. No tongue swelling.   Eyes: EOM are normal. Pupils are equal, round, and reactive to light.  No swelling noted.   Neck: Normal range of motion. Neck supple. No tracheal deviation present.  No palpable cervical lymphadenopathy.   Cardiovascular: Normal rate, regular rhythm and normal  heart sounds.  Exam reveals no gallop and no friction rub.   Pulmonary/Chest: Effort normal and breath sounds normal. No respiratory distress. [name] has no wheezes. [name] has no rales.  Symmetrical rise and fall of the chest. No wheezing. No use of accessory muscles. 100% on RA during examination.   Abdominal: Soft.  Musculoskeletal: Normal range of motion.  Neurological: [name] is alert and oriented to person, place, and time.  Skin: Skin is warm and dry.  No rash or hives noted to the exposed regions.   Psychiatric: [name] has a normal mood and affect.  Nursing note and vitals reviewed.  ED Treatments / Results  Labs (all labs ordered are listed, but only abnormal results are displayed) Labs Reviewed - No data to display  EKG  EKG Interpretation None  Radiology No results found.  Procedures Procedures  DIAGNOSTIC STUDIES: Oxygen Saturation is 100% on RA, normal by my interpretation.    COORDINATION OF CARE: 7:22 PM Discussed next steps with pt. Pt verbalized understanding and is agreeable with the plan.    Medications Ordered in ED Medications - No data to display   Initial Impression / Assessment and Plan / ED Course  I have reviewed the triage vital signs and the nursing notes.  Pertinent labs & imaging results that were available during my care of the patient were reviewed by me and considered in my medical decision making (see chart for details).  Clinical Course    *I have reviewed nursing notes, vital signs, and all appropriate lab and imaging results for this patient.**  **I personally performed the services described in this documentation, which was scribed in my presence. The recorded information has been reviewed and is accurate.* Final Clinical Impressions(s) / ED Diagnoses  Vital signs monitored closely. Pulse oximetry is 100% during the entire emergency department visit. No rash noted during the observed time in the emergency department. The airway remains  patent. The patient speaks clearly, and in complete sentences without problem. There's been no wheezing during the entire emergency department visit.  I've asked the patient to use Decadron 2 times daily for 5 days, to use Claritin during the day, and Benadryl at night. Patient also advised to use Benadryl every 6 hours if symptoms become acute. Patient will return to the emergency department if any acute changes, problems, or concerns.    Final diagnoses:  Insect bite, initial encounter    New Prescriptions New Prescriptions   No medications on file     Ivery QualeHobson Yulia Ulrich, PA-C 02/03/16 1840    Vanetta MuldersScott Zackowski, MD 02/04/16 [id number]

## 2016-02-02 NOTE — ED Triage Notes (Addendum)
PT states she was doing laundry and felt a thought insect bite to right arm with two small red raised area noted x2 hours ago. Pt states she took one benadryl prior to ED arrival. PT states she took a benadryl bc she felt like her lower lip was swelling and tongue but none noted in triage.

## 2016-02-02 NOTE — ED Notes (Signed)
No adverse effects noted to IM injection.  

## 2016-02-02 NOTE — Discharge Instructions (Signed)
You were treated in the emergency department with intramuscular steroid. Please use Benadryl every 4-6 hours as needed for itching, or swelling. Please return to the emergency department if your condition worsens.

## 2016-02-02 NOTE — ED Notes (Signed)
Assumed care of patient from Lauren, patient resting quietly, no respiratory distress. Room air o2 sat 99%

## 2016-03-10 ENCOUNTER — Other Ambulatory Visit (HOSPITAL_COMMUNITY): Payer: Self-pay | Admitting: Family Medicine

## 2016-03-10 ENCOUNTER — Ambulatory Visit (HOSPITAL_COMMUNITY)
Admission: RE | Admit: 2016-03-10 | Discharge: 2016-03-10 | Disposition: A | Discharge status: Home / Self Care | Payer: Medicaid Other | Source: Ambulatory Visit | Attending: Family Medicine | Admitting: Family Medicine

## 2016-03-10 DIAGNOSIS — R600 Localized edema: Secondary | ICD-10-CM

## 2016-04-30 ENCOUNTER — Encounter (HOSPITAL_COMMUNITY): Payer: Self-pay

## 2016-04-30 ENCOUNTER — Emergency Department (HOSPITAL_COMMUNITY)
Admission: EM | Admit: 2016-04-30 | Discharge: 2016-04-30 | Disposition: A | Discharge status: Home / Self Care | Payer: Medicaid Other | Attending: Emergency Medicine | Admitting: Emergency Medicine

## 2016-04-30 ENCOUNTER — Emergency Department (HOSPITAL_COMMUNITY): Payer: Medicaid Other

## 2016-04-30 DIAGNOSIS — J45909 Unspecified asthma, uncomplicated: Secondary | ICD-10-CM | POA: Diagnosis not present

## 2016-04-30 DIAGNOSIS — Z87891 Personal history of nicotine dependence: Secondary | ICD-10-CM | POA: Insufficient documentation

## 2016-04-30 DIAGNOSIS — W108XXA Fall (on) (from) other stairs and steps, initial encounter: Secondary | ICD-10-CM | POA: Insufficient documentation

## 2016-04-30 DIAGNOSIS — E039 Hypothyroidism, unspecified: Secondary | ICD-10-CM | POA: Diagnosis not present

## 2016-04-30 DIAGNOSIS — Y939 Activity, unspecified: Secondary | ICD-10-CM | POA: Diagnosis not present

## 2016-04-30 DIAGNOSIS — Y999 Unspecified external cause status: Secondary | ICD-10-CM | POA: Insufficient documentation

## 2016-04-30 DIAGNOSIS — Y929 Unspecified place or not applicable: Secondary | ICD-10-CM | POA: Insufficient documentation

## 2016-04-30 DIAGNOSIS — S300XXA Contusion of lower back and pelvis, initial encounter: Secondary | ICD-10-CM | POA: Diagnosis not present

## 2016-04-30 DIAGNOSIS — S3992XA Unspecified injury of lower back, initial encounter: Secondary | ICD-10-CM

## 2016-04-30 DIAGNOSIS — Z79899 Other long term (current) drug therapy: Secondary | ICD-10-CM | POA: Diagnosis not present

## 2016-04-30 LAB — I-STAT BETA HCG BLOOD, ED (MC, WL, AP ONLY): I-stat hCG, quantitative: <5 m[IU]/mL (ref <5)

## 2016-04-30 MED ORDER — HYDROCODONE-ACETAMINOPHEN 5-325 MG PO TABS: 1.0000 | ORAL_TABLET | Freq: Four times a day (QID) | ORAL | 0 refills | Status: DC | PRN

## 2016-04-30 MED ORDER — NAPROXEN 500 MG PO TABS: ORAL_TABLET | ORAL | 0 refills | Status: DC

## 2016-04-30 NOTE — ED Triage Notes (Signed)
Pt states she slipped on ice on Tuesday morning, states she landed on her left buttock and is having pain in tailbone area to left hip.

## 2016-04-30 NOTE — ED Provider Notes (Signed)
[facility] Provider Note   CSN: [id number] Arrival date & time: 04/30/16  0015     History   Chief Complaint Chief Complaint  Patient presents with  . Fall    HPI [name] is a 41 y.o. female.  Patient presents to the ED with a chief complaint of fall.  She states that she slipped 3 days ago and landed on her backside.  She reports having a large bruise on her buttocks.  She states that she has increased pain when she is sitting, or getting up from sitting.  She is able to ambulate with with slight difficulty.  She denies any other injuries.  There are no other associated symptoms.  She denies any numbness, weakness, or tingling.   The history is provided by the patient. No language interpreter was used.    Past Medical History:  Diagnosis Date  . Asthma    well controlled.Advair inhaler and Proventil prn  . 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 CP (cerebral palsy) (HCC)    Left side affected  . Cyst of brain   . Depression    Zoloft   . Fibromyalgia   . Gestational diabetes    On Glyburide  . Hypothyroidism    On Synthroid  . Postpartum depression 09/29/2012    Patient Active Problem List   Diagnosis Date Noted  . History of depression 09/29/2012  . Postpartum depression 09/29/2012  . Abnormal maternal glucose tolerance, antepartum 09/04/2012  . Advanced maternal age (AMA) in pregnancy 08/21/2012  . Gestational diabetes mellitus, class A2 06/28/2012  . High-risk pregnancy supervision 06/28/2012  . Previous cesarean section 06/12/2012  . 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 Asthma 05/09/2012  . CP (cerebral palsy) left side affected 05/09/2012  . Fibromyalgia 05/09/2012  . Hypothyroid 05/09/2012  . 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 CARDIAC SURGERY    . CESAREAN SECTION    . CESAREAN SECTION WITH BILATERAL TUBAL LIGATION  Bilateral 09/07/2012   Procedure: CESAREAN SECTION WITH BILATERAL TUBAL LIGATION;  Surgeon: Lazaro Arms, MD;  Location: [facility];  Service: Obstetrics;  Laterality: Bilateral;  Repeat   . NASAL SINUS SURGERY      OB History    Gravida Para Term Preterm AB Living   3 2 2   1 2    SAB TAB Ectopic Multiple Live Births   1       2       Home Medications    Prior to Admission medications   Medication Sig Start Date End Date Taking? Authorizing Provider  albuterol (PROVENTIL HFA;VENTOLIN HFA) 108 (90 BASE) MCG/ACT inhaler Inhale 2 puffs into the lungs every 4 (four) hours as needed for wheezing or shortness of breath.     Historical Provider, MD  albuterol (PROVENTIL) (2.5 MG/3ML) 0.083% nebulizer solution Take 5 mg by nebulization every 4 (four) hours as needed for wheezing or shortness of breath.     Historical Provider, MD  amphetamine-dextroamphetamine (ADDERALL XR) 25 MG 24 hr capsule Take 25 mg by mouth every morning.    Historical Provider, MD  Fluticasone-Salmeterol (ADVAIR) 500-50 MCG/DOSE AEPB Inhale 1 puff into the lungs every 12 (twelve) hours.    Historical Provider, MD  ibuprofen (ADVIL,MOTRIN) 600 MG tablet Take 1 tablet (600 mg total) by mouth every 6 (six) hours. 09/09/12   Arabella Merles, CNM  levothyroxine (SYNTHROID) 200 MCG tablet Take 200 mcg by mouth daily. **Takes with 25mcg tablet for a total of 225mcg daily*    Historical Provider, MD  levothyroxine (SYNTHROID, LEVOTHROID) 25 MCG tablet Take 25 mcg by mouth daily. Taken with 200mcg tablet for a total of 225mcg daily    Historical Provider, MD  oxyCODONE-acetaminophen (PERCOCET/ROXICET) 5-325 MG per tablet Take 1-2 tablets by mouth every 4 (four) hours as needed. 09/09/12   Arabella MerlesKimberly D Shaw, CNM  Prenatal Vit-Fe Fumarate-FA (PRENATAL MULTIVITAMIN) TABS Take 1 tablet by mouth daily.    Historical Provider, MD  sertraline (ZOLOFT) 50 MG tablet Take 100 mg by mouth daily. 06/28/12   Lazaro ArmsLuther H Eure, MD    Family  History Family History  Problem Relation Age of Onset  . Hypertension Mother   . COPD Mother   . Diabetes Father   . Diabetes Paternal Grandmother     Social History Social History  Substance Use Topics  . Smoking status: Former Smoker    Types: Cigarettes  . Smokeless tobacco: Never Used  . Alcohol use No     Allergies   Doxycycline; Lisinopril; Mirapex [pramipexole dihydrochloride]; and Tetracyclines & related   Review of Systems Review of Systems  Musculoskeletal: Positive for back pain.  All other systems reviewed and are negative.    Physical Exam Updated Vital Signs BP 104/84   Pulse 75   Temp 98.5 F (36.9 C)   Resp 18   Ht 5\' 8"  (1.727 m)   Wt 90.7 kg   LMP 04/13/2016   SpO2 100%   BMI 30.41 kg/m   Physical Exam  Constitutional: She is oriented to person, place, and time. No distress.  HENT:  Head: Normocephalic and atraumatic.  Eyes: Conjunctivae and EOM are normal. Pupils are equal, round, and reactive to light.  Neck: No tracheal deviation present.  Cardiovascular: Normal rate.   Pulmonary/Chest: Effort normal. No respiratory distress.  Abdominal: Soft.  Genitourinary:  Genitourinary Comments: Contusion overlying sacral spine, no crepitus, deformity, or step off  Musculoskeletal: Normal range of motion.  Ambulates with slight antalgic gait  Neurological: She is alert and oriented to person, place, and time.  Skin: Skin is warm and dry. She is not diaphoretic.  Psychiatric: Judgment normal.  Nursing note and vitals reviewed.    ED Treatments / Results  Labs (all labs ordered are listed, but only abnormal results are displayed) Labs Reviewed  I-STAT BETA HCG BLOOD, ED ([facility], [facility], [facility] ONLY)    EKG  EKG Interpretation None       Radiology No results found.  Procedures Procedures (including critical care time)  Medications Ordered in ED Medications - No data to display   Initial Impression / Assessment and Plan / ED Course  I  have reviewed the triage vital signs and the nursing notes.  Pertinent labs & imaging results that were available during my care of the patient were reviewed by me and considered in my medical decision making (see chart for details).    Patient with mechanical fall on 3 days ago.  Slipped and fell on some ice landing on her buttock.  Concern for tailbone injury.  Will check plain films.  Anticipate discharge to home.  Dr. Lynelle DoctorKnapp to follow-up on imaging.  Final Clinical Impressions(s) / ED Diagnoses   Final diagnoses:  Injury of coccyx, initial encounter    New Prescriptions New Prescriptions   No medications on file     Roxy HorsemanRobert Aleayah Chico, PA-C 04/30/16 2013  Devoria Albe, MD 05/02/16 2344

## 2016-04-30 NOTE — Discharge Instructions (Signed)
Use ice to the injured area for comfort. Take the medications as prescribed. You may need to sit on a pillow for comfort. Recheck if still painful after a week, call Dr Mort SawyersHarrison's office to get an appointment. He is an Scientist, research (life sciences)orthopedist.

## 2016-04-30 NOTE — ED Provider Notes (Signed)
Pt left at change of shift to get results of her xrays. Pt was given her xray results and discharge instructions. Ice, sit on a pillow, f/u with orthopedics if still painful next week.   Pt noted to be laying on her left side.  Other exam deferred to PA.    Dg Sacrum/coccyx  Result Date: 04/30/2016 CLINICAL DATA:  Tripped and fell on steps 3 days ago. Persistent pain. EXAM: SACRUM AND COCCYX - 2+ VIEW COMPARISON:  None. FINDINGS: There is no evidence of fracture deformity or other focal bone lesions. IMPRESSION: Negative. Electronically Signed   By: Awilda Metroourtnay  Bloomer M.D.   On: 04/30/2016 01:29   Medical screening examination/treatment/procedure(s) were conducted as a shared visit with non-physician practitioner(s) and myself.  I personally evaluated the patient during the encounter.    Devoria AlbeIva Monchel Pollitt, MD, Concha PyoFACEP     Aydden Cumpian, MD 04/30/16 [phone]

## 2017-05-16 IMAGING — US US EXTREM LOW VENOUS*R*
1 series · 14 of 24 positions shown · non-contrast
Comparison: None.

CLINICAL DATA: Right leg edema and cellulitis for 6 days

EXAM:
RIGHT LOWER EXTREMITY VENOUS DUPLEX ULTRASOUND
TECHNIQUE: Doppler venous assessment of the right lower extremity deep venous
system was performed, including characterization of spectral flow,
compressibility, and phasicity.

[Series 1: us extrem low venous*right* · 0.08mm/px · 14 of 35 slices shown]
[im 1/35]
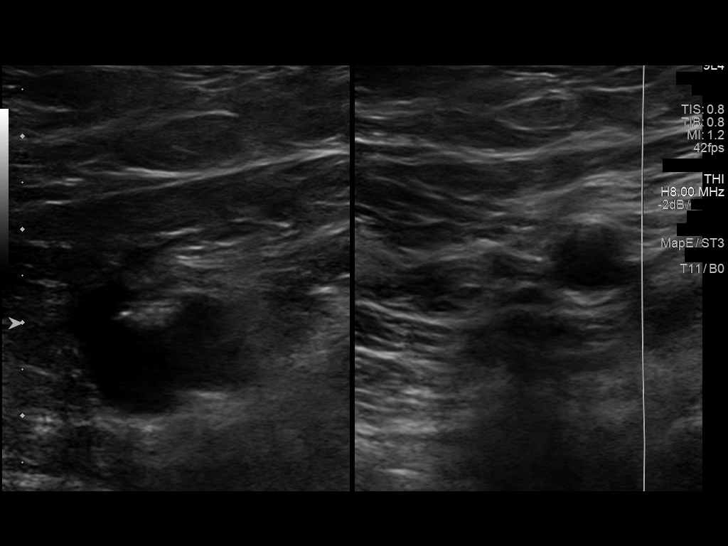
[im 3/35]
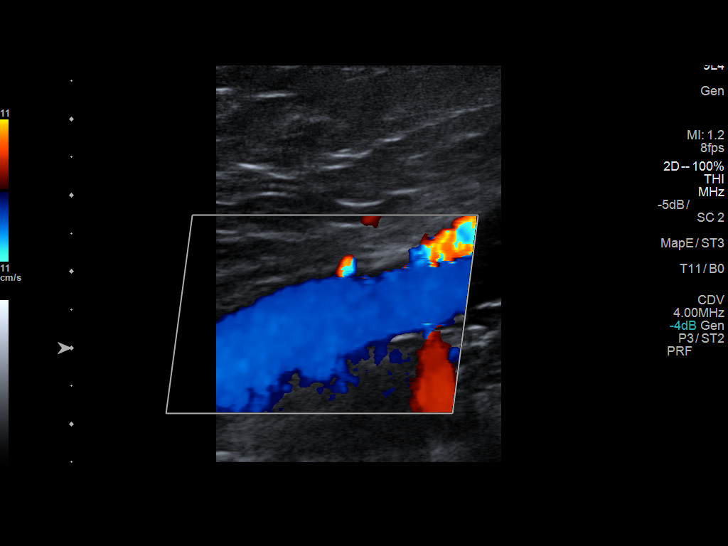
[im 6/35]
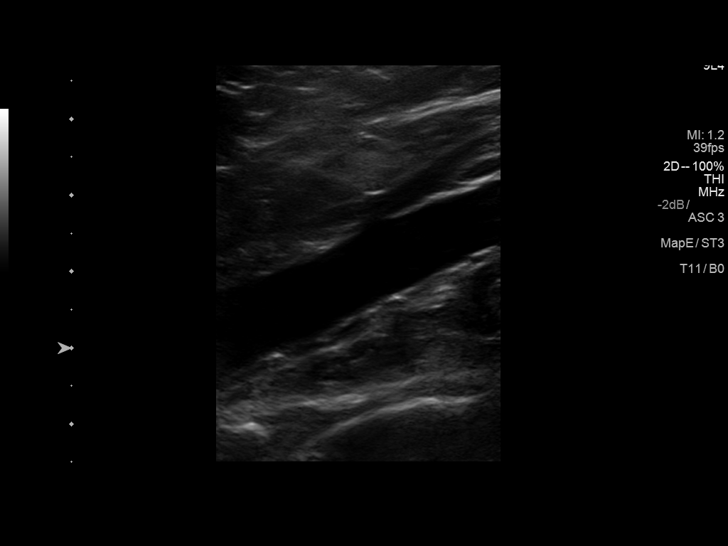
[im 9/35]
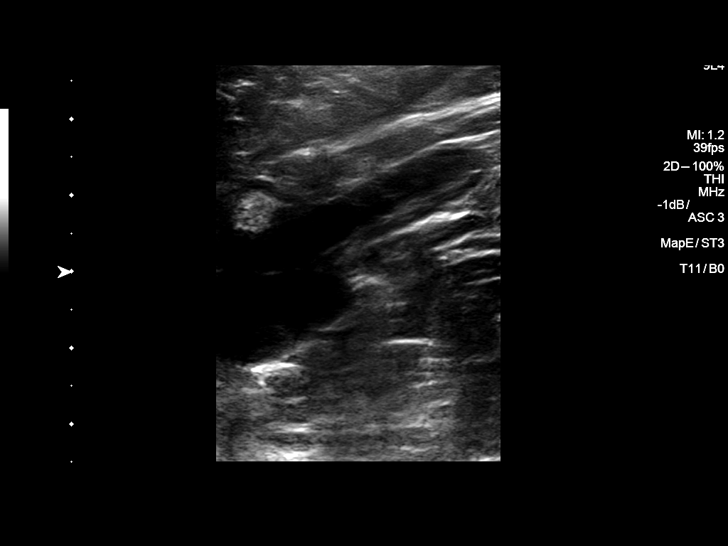
[im 11/35]
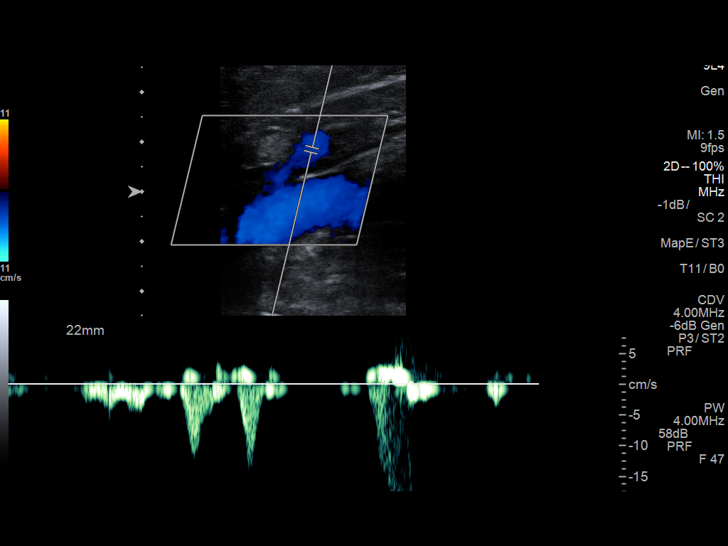
[im 14/35]
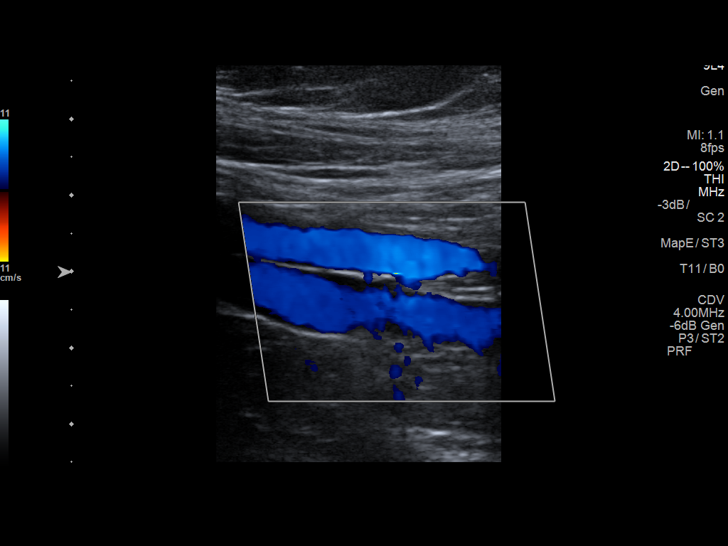
[im 17/35]
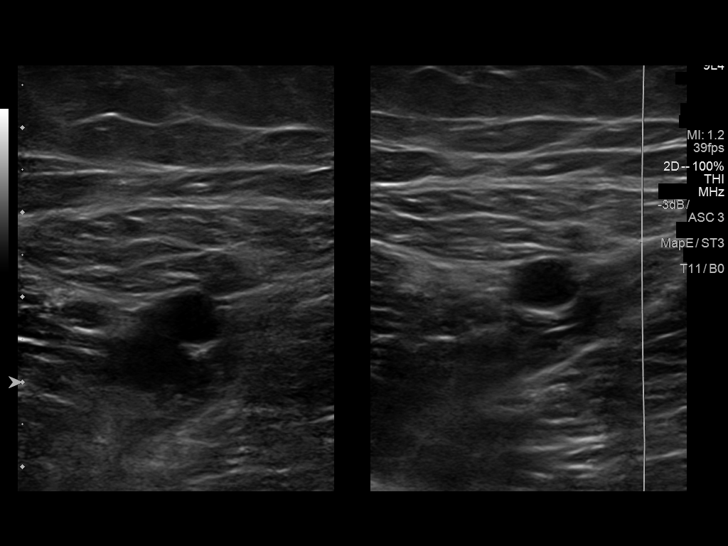
[im 18/35]
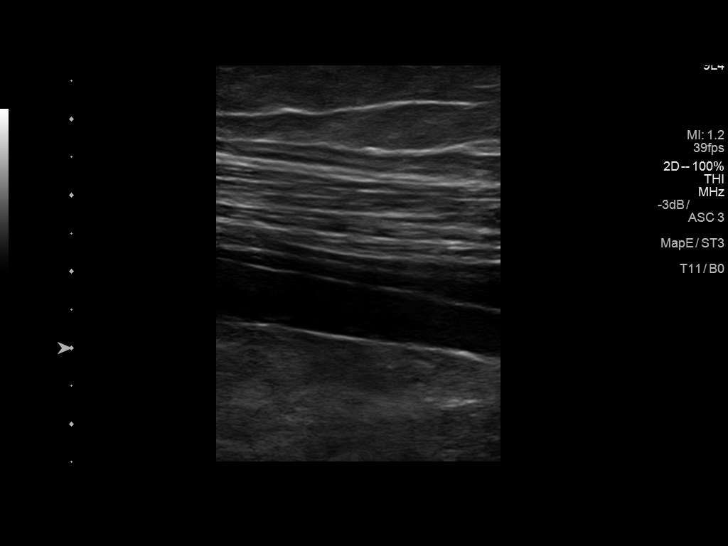
[im 21/35]
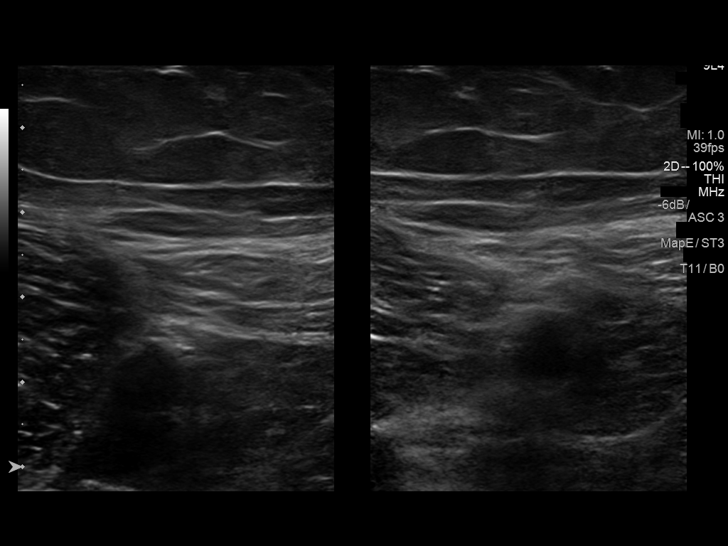
[im 24/35]
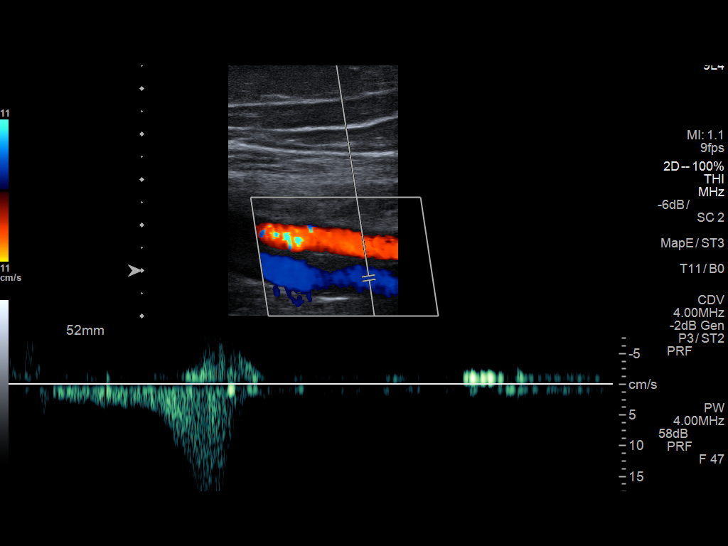
[im 27/35]
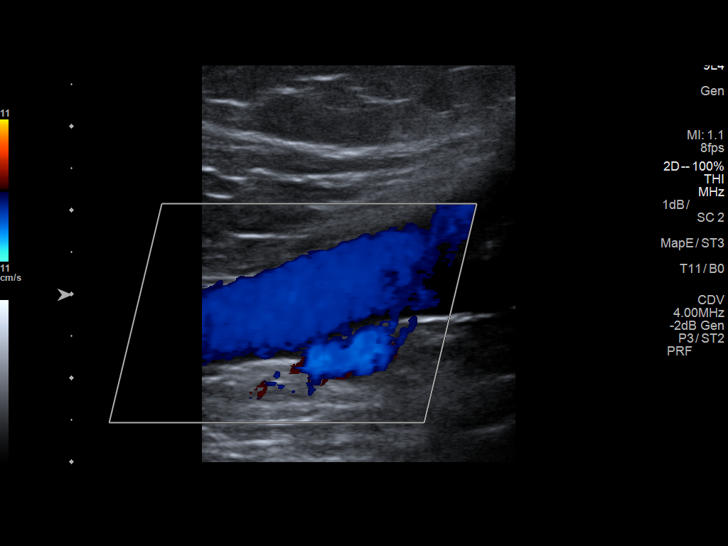
[im 29/35]
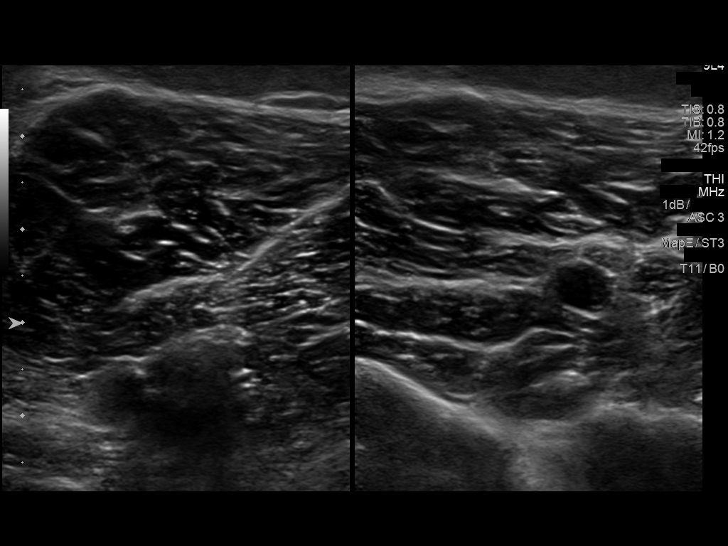
[im 32/35]
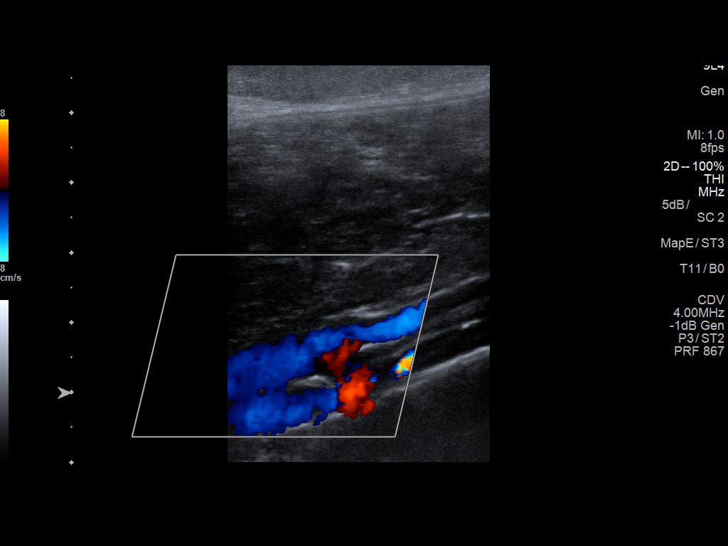
[im 35/35]
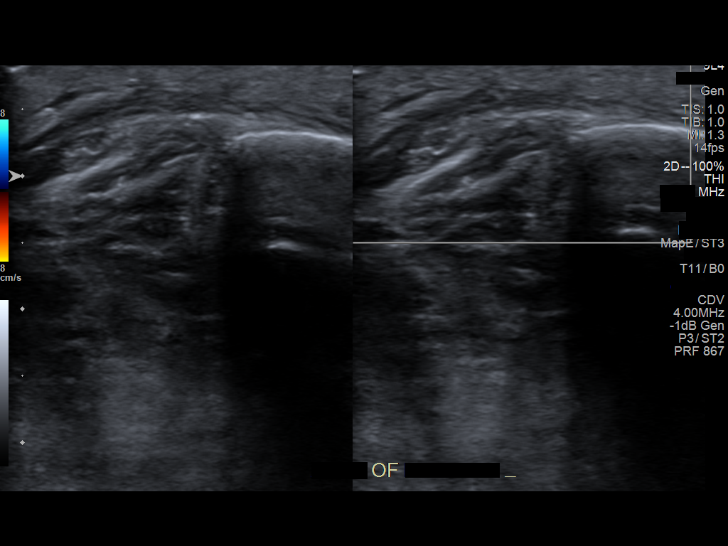

[14 of 24 positions shown; findings below may reference images not displayed]

FINDINGS: There is complete compressibility of the common femoral, femoral,
and popliteal veins. Doppler analysis demonstrates respiratory
phasicity and augmentation of flow with calf compression. No obvious
superficial vein or calf vein thrombosis.
IMPRESSION: No evidence of right lower extremity DVT.

## 2017-07-12 ENCOUNTER — Emergency Department (HOSPITAL_COMMUNITY): Payer: Medicaid Other

## 2017-07-12 ENCOUNTER — Emergency Department (HOSPITAL_COMMUNITY)
Admission: EM | Admit: 2017-07-12 | Discharge: 2017-07-13 | Disposition: A | Discharge status: Home / Self Care | Payer: Medicaid Other | Attending: Emergency Medicine | Admitting: Emergency Medicine

## 2017-07-12 ENCOUNTER — Encounter (HOSPITAL_COMMUNITY): Payer: Self-pay | Admitting: *Deleted

## 2017-07-12 ENCOUNTER — Other Ambulatory Visit: Payer: Self-pay

## 2017-07-12 DIAGNOSIS — E039 Hypothyroidism, unspecified: Secondary | ICD-10-CM | POA: Diagnosis not present

## 2017-07-12 DIAGNOSIS — G809 Cerebral palsy, unspecified: Secondary | ICD-10-CM | POA: Diagnosis not present

## 2017-07-12 DIAGNOSIS — R072 Precordial pain: Secondary | ICD-10-CM | POA: Insufficient documentation

## 2017-07-12 DIAGNOSIS — R091 Pleurisy: Secondary | ICD-10-CM | POA: Diagnosis not present

## 2017-07-12 DIAGNOSIS — Z79899 Other long term (current) drug therapy: Secondary | ICD-10-CM | POA: Insufficient documentation

## 2017-07-12 DIAGNOSIS — R0789 Other chest pain: Secondary | ICD-10-CM | POA: Diagnosis present

## 2017-07-12 DIAGNOSIS — J45909 Unspecified asthma, uncomplicated: Secondary | ICD-10-CM | POA: Diagnosis not present

## 2017-07-12 LAB — COMPREHENSIVE METABOLIC PANEL
ALK PHOS: 76 U/L (ref 38–126)
ALT: 16 U/L (ref 14–54)
ANION GAP: 10 (ref 5–15)
AST: 20 U/L (ref 15–41)
Albumin: 3.8 g/dL (ref 3.5–5.0)
BILIRUBIN TOTAL: 1 mg/dL (ref 0.3–1.2)
BUN: 11 mg/dL (ref 6–20)
CALCIUM: 8.9 mg/dL (ref 8.9–10.3)
CO2: 26 mmol/L (ref 22–32)
Chloride: 100 mmol/L — ABNORMAL LOW (ref 101–111)
Creatinine, Ser: 0.66 mg/dL (ref 0.44–1.00)
Glucose, Bld: 107 mg/dL — ABNORMAL HIGH (ref 65–99)
POTASSIUM: 3.2 mmol/L — AB (ref 3.5–5.1)
Sodium: 136 mmol/L (ref 135–145)
TOTAL PROTEIN: 7.8 g/dL (ref 6.5–8.1)

## 2017-07-12 LAB — CBC WITH DIFFERENTIAL/PLATELET
Basophils Absolute: 0.1 10*3/uL (ref 0.0–0.1)
Basophils Relative: 0 %
Eosinophils Absolute: 0.2 10*3/uL (ref 0.0–0.7)
Eosinophils Relative: 2 %
HEMATOCRIT: 35.7 % — AB (ref 36.0–46.0)
HEMOGLOBIN: 11.2 g/dL — AB (ref 12.0–15.0)
LYMPHS ABS: 3 10*3/uL (ref 0.7–4.0)
Lymphocytes Relative: 25 %
MCH: 25.6 pg — AB (ref 26.0–34.0)
MCHC: 31.4 g/dL (ref 30.0–36.0)
MCV: 81.5 fL (ref 78.0–100.0)
MONO ABS: 1.3 10*3/uL — AB (ref 0.1–1.0)
Monocytes Relative: 11 %
NEUTROS ABS: 7.5 10*3/uL (ref 1.7–7.7)
NEUTROS PCT: 62 %
Platelets: 415 10*3/uL — ABNORMAL HIGH (ref 150–400)
RBC: 4.38 MIL/uL (ref 3.87–5.11)
RDW: 14.3 % (ref 11.5–15.5)
WBC: 11.9 10*3/uL — ABNORMAL HIGH (ref 4.0–10.5)

## 2017-07-12 LAB — TROPONIN I

## 2017-07-12 MED ORDER — MORPHINE SULFATE (PF) 4 MG/ML IV SOLN
4.0000 mg | Freq: Once | INTRAVENOUS | Status: AC
  Administered 2017-07-12: 4 mg via INTRAVENOUS
  Filled 2017-07-12: qty 1

## 2017-07-12 MED ORDER — NITROGLYCERIN 0.4 MG SL SUBL
SUBLINGUAL_TABLET | SUBLINGUAL | Status: AC
  Filled 2017-07-12: qty 1

## 2017-07-12 MED ORDER — SODIUM CHLORIDE 0.9 % IV BOLUS
500.0000 mL | Freq: Once | INTRAVENOUS | Status: AC
  Administered 2017-07-12: 500 mL via INTRAVENOUS

## 2017-07-12 MED ORDER — NITROGLYCERIN 0.4 MG SL SUBL
0.4000 mg | SUBLINGUAL_TABLET | SUBLINGUAL | Status: DC | PRN
  Administered 2017-07-12: 0.4 mg via SUBLINGUAL

## 2017-07-12 NOTE — ED Notes (Signed)
ED Provider at bedside. 

## 2017-07-12 NOTE — ED Triage Notes (Signed)
Pt brought in by [facility] for c/o chest pain; pt is c/o some left side flank pain x 1 week; pt denies any n/v; pt states she has pain when she takes a deep breath

## 2017-07-13 ENCOUNTER — Emergency Department (HOSPITAL_COMMUNITY): Payer: Medicaid Other

## 2017-07-13 LAB — URINALYSIS, ROUTINE W REFLEX MICROSCOPIC
Bilirubin Urine: NEGATIVE
GLUCOSE, UA: NEGATIVE mg/dL
Hgb urine dipstick: NEGATIVE
KETONES UR: NEGATIVE mg/dL
LEUKOCYTES UA: NEGATIVE
Nitrite: NEGATIVE
Protein, ur: NEGATIVE mg/dL
SPECIFIC GRAVITY, URINE: 1.019 (ref 1.005–1.030)
pH: 5 (ref 5.0–8.0)

## 2017-07-13 LAB — TROPONIN I

## 2017-07-13 LAB — D-DIMER, QUANTITATIVE: D-Dimer, Quant: <0.27 ug/mL-FEU (ref 0.00–0.50)

## 2017-07-13 MED ORDER — METHOCARBAMOL 500 MG PO TABS: 500.0000 mg | ORAL_TABLET | Freq: Two times a day (BID) | ORAL | 0 refills | Status: DC

## 2017-07-13 MED ORDER — KETOROLAC TROMETHAMINE 30 MG/ML IJ SOLN
15.0000 mg | Freq: Once | INTRAMUSCULAR | Status: AC
  Administered 2017-07-13: 15 mg via INTRAVENOUS
  Filled 2017-07-13: qty 1

## 2017-07-13 MED ORDER — NAPROXEN 500 MG PO TABS: 500.0000 mg | ORAL_TABLET | Freq: Two times a day (BID) | ORAL | 0 refills | Status: DC

## 2017-07-13 NOTE — ED Provider Notes (Signed)
[facility] EMERGENCY DEPARTMENT Provider Note   CSN: [id number] Arrival date & time: 07/12/17  2142     History   Chief Complaint Chief Complaint  Patient presents with  . Flank Pain    HPI [name] is a 42 y.o. female.  HPI 42 year old comes in with chief complaint of flank pain. Patient was born with cardiac abnormality and has history of asthma, fibromyalgia and depression. She states that she started having chest pain about 1 week ago.  Patient's pain is located in the lower part of her left chest and radiates to the flank region.  Patient's pain is also radiated towards her scapular region.  Pain is described as sharp pain, which was intermittent, but now constant.  Pain is worse when patient is trying to get up and better when she is laying flat.  Patient denies any exertional component to the pain but she does state that her pain is worse with deep inspiration.  Patient denies any cough, shortness of breath, nausea, diaphoresis.  She has no history of kidney stones and denies any UTI-like symptoms for pelvic organ disorder.  Pt has no hx of PE, DVT and denies any exogenous hormone (testosterone / estrogen) use, long distance travels or surgery in the past 6 weeks, active cancer, recent immobilization.  Patient does not smoke and does not have premature CAD in her family.  Patient does not have any history of similar pain in the recent past.  Past Medical History:  Diagnosis Date  . Asthma    well controlled.Advair inhaler and Proventil prn  . 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 CP (cerebral palsy) (HCC)    Left side affected  . Cyst of brain   . Depression    Zoloft   . Fibromyalgia   . Gestational diabetes    On Glyburide  . Hypothyroidism    On Synthroid  . Postpartum depression 09/29/2012    Patient Active Problem List   Diagnosis Date Noted  . History of depression 09/29/2012  .  Postpartum depression 09/29/2012  . Abnormal maternal glucose tolerance, antepartum 09/04/2012  . Advanced maternal age (AMA) in pregnancy 08/21/2012  . Gestational diabetes mellitus, class A2 06/28/2012  . High-risk pregnancy supervision 06/28/2012  . Previous cesarean section 06/12/2012  . 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 Asthma 05/09/2012  . CP (cerebral palsy) left side affected 05/09/2012  . Fibromyalgia 05/09/2012  . Hypothyroid 05/09/2012  . 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 CARDIAC SURGERY    . CESAREAN SECTION    . CESAREAN SECTION WITH BILATERAL TUBAL LIGATION Bilateral 09/07/2012   Procedure: CESAREAN SECTION WITH BILATERAL TUBAL LIGATION;  Surgeon: Lazaro Arms, MD;  Location: [facility];  Service: Obstetrics;  Laterality: Bilateral;  Repeat   . NASAL SINUS SURGERY       OB History    Gravida  3   Para  2   Term  2   Preterm      AB  1   Living  2     SAB  1   TAB      Ectopic      Multiple      Live Births  2            Home Medications    Prior to Admission medications   Medication Sig Start Date End  Date Taking? Authorizing Provider  albuterol (PROVENTIL HFA;VENTOLIN HFA) 108 (90 BASE) MCG/ACT inhaler Inhale 2 puffs into the lungs every 4 (four) hours as needed for wheezing or shortness of breath.    Yes [provider]  albuterol (PROVENTIL) (2.5 MG/3ML) 0.083% nebulizer solution Take 5 mg by nebulization every 4 (four) hours as needed for wheezing or shortness of breath.    Yes [provider]  ALPRAZolam Prudy Feeler) 1 MG tablet Take 1 mg by mouth 3 (three) times daily as needed for anxiety.   Yes [provider]  amphetamine-dextroamphetamine (ADDERALL XR) 25 MG 24 hr capsule Take 25 mg by mouth every morning.   Yes [provider]  FLUoxetine (PROZAC) 20 MG capsule Take 20 mg by mouth daily.   Yes [provider]  Fluticasone-Salmeterol  (ADVAIR) 500-50 MCG/DOSE AEPB Inhale 1 puff into the lungs every 12 (twelve) hours.   Yes [provider]  levothyroxine (SYNTHROID) 300 MCG tablet Take 300 mcg by mouth daily.    Yes [provider]  methocarbamol (ROBAXIN) 500 MG tablet Take 1 tablet (500 mg total) by mouth 2 (two) times daily. 07/13/17   Derwood Kaplan, MD  naproxen (NAPROSYN) 500 MG tablet Take 1 tablet (500 mg total) by mouth 2 (two) times daily. 07/13/17   Derwood Kaplan, MD    Family History Family History  Problem Relation Age of Onset  . Hypertension Mother   . COPD Mother   . Diabetes Father   . Diabetes Paternal Grandmother     Social History Social History   Tobacco Use  . Smoking status: Former Smoker    Types: Cigarettes  . Smokeless tobacco: Never Used  Substance Use Topics  . Alcohol use: No  . Drug use: No     Allergies   Doxycycline; Lisinopril; Mirapex [pramipexole dihydrochloride]; and Tetracyclines & related   Review of Systems Review of Systems  Constitutional: Positive for activity change.  Respiratory: Positive for chest tightness.   Cardiovascular: Positive for chest pain.  Gastrointestinal: Negative for nausea and vomiting.  Musculoskeletal: Positive for myalgias.  All other systems reviewed and are negative.    Physical Exam Updated Vital Signs BP 113/74   Pulse 84   Temp 98.9 F (37.2 C) (Oral)   Resp 15   Ht 5' 8.75" (1.746 m)   Wt 90.7 kg (200 lb)   LMP 07/05/2017   SpO2 97%   BMI 29.75 kg/m   Physical Exam  Constitutional: She is oriented to person, place, and time. She appears well-developed.  HENT:  Head: Normocephalic and atraumatic.  Eyes: EOM are normal.  Neck: Normal range of motion. Neck supple.  Cardiovascular: Normal rate and intact distal pulses.  Bilateral radial and DP pulses are equal and bounding  Pulmonary/Chest: Effort normal. She has no wheezes. She has no rales. She exhibits tenderness.  Reproducible tenderness with  palpation over the left lower thoracic region.  No abdominal tenderness and no splenomegaly.  Patient also has left flank tenderness  Abdominal: Bowel sounds are normal.  Neurological: She is alert and oriented to person, place, and time.  Skin: Skin is warm and dry.  Nursing note and vitals reviewed.    ED Treatments / Results  Labs (all labs ordered are listed, but only abnormal results are displayed) Labs Reviewed  CBC WITH DIFFERENTIAL/PLATELET - Abnormal; Notable for the following components:      Result Value   WBC 11.9 (*)    Hemoglobin 11.2 (*)    HCT  35.7 (*)    MCH 25.6 (*)    Platelets 415 (*)    Monocytes Absolute 1.3 (*)    All other components within normal limits  COMPREHENSIVE METABOLIC PANEL - Abnormal; Notable for the following components:   Potassium 3.2 (*)    Chloride 100 (*)    Glucose, Bld 107 (*)    All other components within normal limits  TROPONIN I  D-DIMER, QUANTITATIVE (NOT AT [facility])  TROPONIN I  URINALYSIS, ROUTINE W REFLEX MICROSCOPIC    EKG EKG Interpretation  Date/Time:  Tuesday Jul 12 2017 23:15:47 EDT Ventricular Rate:  92 PR Interval:    QRS Duration: 78 QT Interval:  380 QTC Calculation: 471 R Axis:   95 Text Interpretation:  Sinus rhythm Anteroseptal infarct, age indeterminate No acute changes Nonspecific ST and T wave abnormality No old tracing to compare Confirmed by Derwood Kaplan [phone]) on 07/12/2017 11:30:09 PM   Radiology Dg Chest 2 View  Result Date: 07/13/2017 CLINICAL DATA:  Acute onset of left lower chest pain under the breast. EXAM: CHEST - 2 VIEW COMPARISON:  Chest radiograph performed 02/27/2008 FINDINGS: The lungs are well-aerated and clear. There is no evidence of focal opacification, pleural effusion or pneumothorax. The heart is normal in size; the mediastinal contour is within normal limits. No acute osseous abnormalities are seen. IMPRESSION: No acute cardiopulmonary process seen. Electronically Signed   By:  Roanna Raider M.D.   On: 07/13/2017 00:49    Procedures Procedures (including critical care time)  Medications Ordered in ED Medications  nitroGLYCERIN (NITROSTAT) SL tablet 0.4 mg (0.4 mg Sublingual Given 07/12/17 2341)  sodium chloride 0.9 % bolus 500 mL (0 mLs Intravenous Stopped 07/13/17 0135)  morphine 4 MG/ML injection 4 mg (4 mg Intravenous Given 07/12/17 2350)  ketorolac (TORADOL) 30 MG/ML injection 15 mg (15 mg Intravenous Given 07/13/17 0226)     Initial Impression / Assessment and Plan / ED Course  I have reviewed the triage vital signs and the nursing notes.  Pertinent labs & imaging results that were available during my care of the patient were reviewed by me and considered in my medical decision making (see chart for details).  Clinical Course as of Jul 13 324  Wed Jul 13, 2017  7829 Results from the ER workup discussed with the patient face to face and all questions answered to the best of my ability.   DG Chest 2 View [AN]    Clinical Course User Index [AN] Derwood Kaplan, MD    Differential diagnosis includes: ACS syndrome Aortic dissection Myocarditis Pericarditis Pneumonia Pleural effusion / Pulmonary edema PE Pneumothorax Musculoskeletal pain PUD / Gastritis / Esophagitis  42 year old comes in with chief complaint of left-sided chest pain.  Patient's pain is located in the lower part of her chest and radiates to the flank region and also has radiated to the left scapular region.  Pain was intermittent for the last week, but now has gotten more constant.  Pain is not exertional but there is pleuritic component to the pain and it is reproducible with palpation.  Differential diagnosis mostly includes PE, pyelonephritis, pneumonia, renal colic, ACS, costochondritis.  Dissection also considered in the differential, however vascular exam is normal and patient does not have any risk factors for dissection.   Plan is to get troponins x2 and d-dimer. Chest  x-ray has been reviewed and looks normal. EKG reviewed. HEAR score is 2 - for ekg and risk factor. If the troponin x2 are negative then we  will discharge patient with PCP follow-up.  Final Clinical Impressions(s) / ED Diagnoses   Final diagnoses:  Chest wall pain  Precordial chest pain  Pleurisy    ED Discharge Orders        Ordered    naproxen (NAPROSYN) 500 MG tablet  2 times daily     07/13/17 0319    methocarbamol (ROBAXIN) 500 MG tablet  2 times daily     07/13/17 0319       Derwood Kaplan, MD 07/13/17 0327

## 2017-07-13 NOTE — Discharge Instructions (Signed)
We saw you in the ER for the chest pain/shortness of breath. All of our cardiac workup is normal, including labs, EKG and chest X-RAY are normal. We also screen you for blood clot in the lungs, and that test result is also normal.  We are not sure what is causing your discomfort, but suspect chest wall pain. The workup in the ER is not complete, and you should follow up with your primary care doctor for further evaluation.  Please return to the ER if you have worsening chest pain, shortness of breath, pain radiating to your jaw, shoulder, or back, sweats or fainting. Otherwise see your primary care doctor as requested.

## 2017-10-29 ENCOUNTER — Encounter (HOSPITAL_COMMUNITY): Payer: Self-pay | Admitting: *Deleted

## 2017-10-29 ENCOUNTER — Ambulatory Visit (HOSPITAL_COMMUNITY)
Admission: EM | Admit: 2017-10-29 | Discharge: 2017-10-29 | Disposition: A | Discharge status: Home / Self Care | Payer: Medicaid Other | Attending: Family Medicine | Admitting: Family Medicine

## 2017-10-29 ENCOUNTER — Other Ambulatory Visit: Payer: Self-pay

## 2017-10-29 DIAGNOSIS — M722 Plantar fascial fibromatosis: Secondary | ICD-10-CM

## 2017-10-29 MED ORDER — NAPROXEN 500 MG PO TABS: 500.0000 mg | ORAL_TABLET | Freq: Two times a day (BID) | ORAL | 0 refills | Status: DC

## 2017-10-29 NOTE — ED Triage Notes (Signed)
Left foot hurts the most painful for the past 2 wks, pain starts from the center to the heal, Per pt she has C.P

## 2017-10-29 NOTE — ED Provider Notes (Signed)
[facility]   [id number] 10/29/17 Arrival Time: 1157  ASSESSMENT & PLAN:  1. Plantar fasciitis    Trial of: Meds ordered this encounter  Medications  . naproxen (NAPROSYN) 500 MG tablet    Sig: Take 1 tablet (500 mg total) by mouth 2 (two) times daily.    Dispense:  20 tablet    Refill:  0    Follow-up Information    Gareth MorganKnowlton, Steve, MD.   Specialty:  Family Medicine Why:  As needed. Contact information: [address] [place] [id number] [phone]          Weight bearing as tolerated. May f/u here as needed.  Reviewed expectations re: course of current medical issues. Questions answered. Outlined signs and symptoms indicating need for more acute intervention. Patient verbalized understanding. After Visit Summary given.  SUBJECTIVE: History from: patient. [name] is a 42 y.o. female who reports persistent mild to moderate pain of her left plantar foot/heel that has gradually worsened since beginning; described as aching without radiation. Onset: gradual, over the past two weeks. Injury/trama: no. Relieved by: rest. Worsened by: weight bearing. Associated symptoms: none reported. Extremity sensation changes or weakness: none. Self treatment: has not tried OTCs for relief of pain. History of similar: no No new shoes. Does stand a lot during the day.  ROS: As per HPI.   OBJECTIVE:  Vitals:   10/29/17 1300  BP: 102/70  Pulse: (!) 101  Temp: 100.1 F (37.8 C)  TempSrc: Oral  SpO2: 99%    General appearance: alert; no distress Extremities: warm and well perfused; symmetrical with no gross deformities; poorly localized tenderness over her left plantar foot near heel with no swelling and no bruising; ROM: normal; no Achilles tenderness CV: brisk extremity capillary refill Skin: warm and dry Neurologic: normal gait; normal symmetric reflexes in all extremities; normal sensation in all extremities Psychological: alert and  cooperative; normal mood and affect  Allergies  Allergen Reactions  . Doxycycline Other (See Comments)    Unknown reaction  . Lisinopril Other (See Comments)    unkown  . Mirapex [Pramipexole Dihydrochloride] Hives  . Tetracyclines & Related     "made me feel like i was going crazy"    Past Medical History:  Diagnosis Date  . Asthma    well controlled.Advair inhaler and Proventil prn  . 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 CP (cerebral palsy) (HCC)    Left side affected  . Cyst of brain   . Depression    Zoloft   . Fibromyalgia   . Gestational diabetes    On Glyburide  . Hypothyroidism    On Synthroid  . Postpartum depression 09/29/2012   Social History   Socioeconomic History  . Marital status: Legally Separated    Spouse name: Not on file  . Number of children: Not on file  . Years of education: Not on file  . Highest education level: Not on file  Occupational History  . Not on file  Social Needs  . Financial resource strain: Not on file  . Food insecurity:    Worry: Not on file    Inability: Not on file  . Transportation needs:    Medical: Not on file    Non-medical: Not on file  Tobacco Use  . Smoking status: Former Smoker    Types: Cigarettes  . Smokeless tobacco: Never Used  Substance and Sexual Activity  .  Alcohol use: No  . Drug use: No  . Sexual activity: Not Currently    Birth control/protection: None  Lifestyle  . Physical activity:    Days per week: Not on file    Minutes per session: Not on file  . Stress: Not on file  Relationships  . Social connections:    Talks on phone: Not on file    Gets together: Not on file    Attends religious service: Not on file    Active member of club or organization: Not on file    Attends meetings of clubs or organizations: Not on file    Relationship status: Not on file  Other Topics Concern  . Not on file  Social History Narrative  .  Not on file   Family History  Problem Relation Age of Onset  . Hypertension Mother   . COPD Mother   . Diabetes Father   . Diabetes Paternal Grandmother    Past Surgical History:  Procedure Laterality Date  . CARDIAC SURGERY    . CESAREAN SECTION    . CESAREAN SECTION WITH BILATERAL TUBAL LIGATION Bilateral 09/07/2012   Procedure: CESAREAN SECTION WITH BILATERAL TUBAL LIGATION;  Surgeon: Lazaro Arms, MD;  Location: [facility];  Service: Obstetrics;  Laterality: Bilateral;  Repeat   . NASAL SINUS SURGERY        Mardella Layman, MD 11/03/17 [phone]

## 2017-10-29 NOTE — ED Notes (Signed)
donjoy cam walker applied

## 2018-12-05 ENCOUNTER — Other Ambulatory Visit: Payer: Self-pay

## 2018-12-05 DIAGNOSIS — Z20822 Contact with and (suspected) exposure to covid-19: Secondary | ICD-10-CM

## 2018-12-08 LAB — NOVEL CORONAVIRUS, NAA: SARS-CoV-2, NAA: NOT DETECTED

## 2019-08-27 ENCOUNTER — Other Ambulatory Visit (HOSPITAL_COMMUNITY): Payer: Self-pay | Admitting: Nurse Practitioner

## 2019-08-27 DIAGNOSIS — Z1231 Encounter for screening mammogram for malignant neoplasm of breast: Secondary | ICD-10-CM

## 2019-09-12 ENCOUNTER — Ambulatory Visit (HOSPITAL_COMMUNITY): Payer: Medicaid Other

## 2020-03-11 ENCOUNTER — Other Ambulatory Visit: Payer: Medicaid Other

## 2020-03-15 ENCOUNTER — Other Ambulatory Visit: Payer: Medicaid Other

## 2020-03-15 DIAGNOSIS — Z20822 Contact with and (suspected) exposure to covid-19: Secondary | ICD-10-CM

## 2020-03-18 LAB — NOVEL CORONAVIRUS, NAA: SARS-CoV-2, NAA: DETECTED — AB

## 2020-05-07 DIAGNOSIS — Z7901 Long term (current) use of anticoagulants: Secondary | ICD-10-CM | POA: Diagnosis not present

## 2020-05-07 DIAGNOSIS — D649 Anemia, unspecified: Secondary | ICD-10-CM | POA: Diagnosis not present

## 2020-05-07 DIAGNOSIS — G629 Polyneuropathy, unspecified: Secondary | ICD-10-CM | POA: Diagnosis not present

## 2020-05-07 DIAGNOSIS — E039 Hypothyroidism, unspecified: Secondary | ICD-10-CM | POA: Diagnosis not present

## 2020-05-07 DIAGNOSIS — F3341 Major depressive disorder, recurrent, in partial remission: Secondary | ICD-10-CM | POA: Diagnosis not present

## 2020-05-07 DIAGNOSIS — Z79891 Long term (current) use of opiate analgesic: Secondary | ICD-10-CM | POA: Diagnosis not present

## 2020-05-07 DIAGNOSIS — D509 Iron deficiency anemia, unspecified: Secondary | ICD-10-CM | POA: Diagnosis not present

## 2020-05-07 DIAGNOSIS — F419 Anxiety disorder, unspecified: Secondary | ICD-10-CM | POA: Diagnosis not present

## 2020-05-07 DIAGNOSIS — E782 Mixed hyperlipidemia: Secondary | ICD-10-CM | POA: Diagnosis not present

## 2020-05-07 DIAGNOSIS — M797 Fibromyalgia: Secondary | ICD-10-CM | POA: Diagnosis not present

## 2020-05-07 DIAGNOSIS — I10 Essential (primary) hypertension: Secondary | ICD-10-CM | POA: Diagnosis not present

## 2020-05-07 DIAGNOSIS — G808 Other cerebral palsy: Secondary | ICD-10-CM | POA: Diagnosis not present

## 2020-07-09 ENCOUNTER — Other Ambulatory Visit (HOSPITAL_COMMUNITY): Payer: Self-pay | Admitting: Nurse Practitioner

## 2020-07-09 DIAGNOSIS — E782 Mixed hyperlipidemia: Secondary | ICD-10-CM | POA: Diagnosis not present

## 2020-07-09 DIAGNOSIS — E039 Hypothyroidism, unspecified: Secondary | ICD-10-CM | POA: Diagnosis not present

## 2020-07-09 DIAGNOSIS — Z1231 Encounter for screening mammogram for malignant neoplasm of breast: Secondary | ICD-10-CM

## 2020-07-09 DIAGNOSIS — F419 Anxiety disorder, unspecified: Secondary | ICD-10-CM | POA: Diagnosis not present

## 2020-07-09 DIAGNOSIS — M797 Fibromyalgia: Secondary | ICD-10-CM | POA: Diagnosis not present

## 2020-08-04 ENCOUNTER — Ambulatory Visit (HOSPITAL_COMMUNITY)
Admission: RE | Admit: 2020-08-04 | Discharge: 2020-08-04 | Disposition: A | Discharge status: Home / Self Care | Payer: Medicaid Other | Source: Ambulatory Visit | Attending: Nurse Practitioner | Admitting: Nurse Practitioner

## 2020-08-04 DIAGNOSIS — Z1231 Encounter for screening mammogram for malignant neoplasm of breast: Secondary | ICD-10-CM | POA: Insufficient documentation

## 2020-08-06 ENCOUNTER — Other Ambulatory Visit (HOSPITAL_COMMUNITY): Payer: Self-pay | Admitting: Nurse Practitioner

## 2020-08-06 DIAGNOSIS — R928 Other abnormal and inconclusive findings on diagnostic imaging of breast: Secondary | ICD-10-CM

## 2020-08-26 ENCOUNTER — Other Ambulatory Visit: Payer: Self-pay

## 2020-08-26 ENCOUNTER — Ambulatory Visit (HOSPITAL_COMMUNITY)
Admission: RE | Admit: 2020-08-26 | Discharge: 2020-08-26 | Disposition: A | Discharge status: Home / Self Care | Payer: Medicaid Other | Source: Ambulatory Visit | Attending: Nurse Practitioner | Admitting: Nurse Practitioner

## 2020-08-26 DIAGNOSIS — R928 Other abnormal and inconclusive findings on diagnostic imaging of breast: Secondary | ICD-10-CM | POA: Insufficient documentation

## 2020-08-26 DIAGNOSIS — R922 Inconclusive mammogram: Secondary | ICD-10-CM | POA: Diagnosis not present

## 2020-08-26 DIAGNOSIS — R921 Mammographic calcification found on diagnostic imaging of breast: Secondary | ICD-10-CM | POA: Diagnosis not present

## 2020-08-27 ENCOUNTER — Other Ambulatory Visit (HOSPITAL_COMMUNITY): Payer: Self-pay | Admitting: Nurse Practitioner

## 2020-08-27 DIAGNOSIS — R921 Mammographic calcification found on diagnostic imaging of breast: Secondary | ICD-10-CM

## 2020-08-27 DIAGNOSIS — R928 Other abnormal and inconclusive findings on diagnostic imaging of breast: Secondary | ICD-10-CM

## 2020-08-29 HISTORY — PX: BREAST BIOPSY: SHX20

## 2020-09-24 ENCOUNTER — Ambulatory Visit
Admission: RE | Admit: 2020-09-24 | Discharge: 2020-09-24 | Disposition: A | Discharge status: Home / Self Care | Payer: Medicaid Other | Source: Ambulatory Visit | Attending: Nurse Practitioner | Admitting: Nurse Practitioner

## 2020-09-24 ENCOUNTER — Other Ambulatory Visit: Payer: Self-pay

## 2020-09-24 DIAGNOSIS — R921 Mammographic calcification found on diagnostic imaging of breast: Secondary | ICD-10-CM

## 2020-09-24 DIAGNOSIS — R928 Other abnormal and inconclusive findings on diagnostic imaging of breast: Secondary | ICD-10-CM

## 2020-09-24 DIAGNOSIS — N6489 Other specified disorders of breast: Secondary | ICD-10-CM | POA: Diagnosis not present

## 2020-10-10 DIAGNOSIS — I10 Essential (primary) hypertension: Secondary | ICD-10-CM | POA: Diagnosis not present

## 2020-10-10 DIAGNOSIS — E039 Hypothyroidism, unspecified: Secondary | ICD-10-CM | POA: Diagnosis not present

## 2020-10-10 DIAGNOSIS — F3341 Major depressive disorder, recurrent, in partial remission: Secondary | ICD-10-CM | POA: Diagnosis not present

## 2020-10-10 DIAGNOSIS — M797 Fibromyalgia: Secondary | ICD-10-CM | POA: Diagnosis not present

## 2020-12-22 ENCOUNTER — Other Ambulatory Visit: Payer: Self-pay

## 2020-12-22 ENCOUNTER — Ambulatory Visit
Admission: EM | Admit: 2020-12-22 | Discharge: 2020-12-22 | Disposition: A | Discharge status: Home / Self Care | Payer: Medicaid Other | Attending: Urgent Care | Admitting: Urgent Care

## 2020-12-22 DIAGNOSIS — B349 Viral infection, unspecified: Secondary | ICD-10-CM | POA: Diagnosis not present

## 2020-12-22 DIAGNOSIS — R52 Pain, unspecified: Secondary | ICD-10-CM | POA: Diagnosis not present

## 2020-12-22 DIAGNOSIS — R070 Pain in throat: Secondary | ICD-10-CM | POA: Diagnosis not present

## 2020-12-22 LAB — POCT RAPID STREP A (OFFICE): Rapid Strep A Screen: NEGATIVE

## 2020-12-22 NOTE — ED Provider Notes (Signed)
[facility]   MRN: [id number] DOB: Mar 28, 1975  Subjective:   [name] is a 45 y.o. female presenting for 3 day history of throat pain, body aches, intermittent headaches. Denies cough, chest pain, shob, wheezing, runny or stuffy nose, ear pain. No history of asthma, allergies. Symptoms started after she was camping this past weekend.   No current facility-administered medications for this encounter.  Current Outpatient Medications:    albuterol (PROVENTIL HFA;VENTOLIN HFA) 108 (90 BASE) MCG/ACT inhaler, Inhale 2 puffs into the lungs every 4 (four) hours as needed for wheezing or shortness of breath. , Disp: , Rfl:    albuterol (PROVENTIL) (2.5 MG/3ML) 0.083% nebulizer solution, Take 5 mg by nebulization every 4 (four) hours as needed for wheezing or shortness of breath. , Disp: , Rfl:    ALPRAZolam (XANAX) 1 MG tablet, Take 1 mg by mouth 3 (three) times daily as needed for anxiety., Disp: , Rfl:    amphetamine-dextroamphetamine (ADDERALL XR) 25 MG 24 hr capsule, Take 25 mg by mouth every morning., Disp: , Rfl:    FLUoxetine (PROZAC) 20 MG capsule, Take 20 mg by mouth daily., Disp: , Rfl:    Fluticasone-Salmeterol (ADVAIR) 500-50 MCG/DOSE AEPB, Inhale 1 puff into the lungs every 12 (twelve) hours., Disp: , Rfl:    levothyroxine (SYNTHROID) 300 MCG tablet, Take 300 mcg by mouth daily. , Disp: , Rfl:    methocarbamol (ROBAXIN) 500 MG tablet, Take 1 tablet (500 mg total) by mouth 2 (two) times daily., Disp: 20 tablet, Rfl: 0   naproxen (NAPROSYN) 500 MG tablet, Take 1 tablet (500 mg total) by mouth 2 (two) times daily., Disp: 20 tablet, Rfl: 0   Allergies  Allergen Reactions   Doxycycline Other (See Comments)    Unknown reaction   Lisinopril Other (See Comments)    unkown   Mirapex [Pramipexole Dihydrochloride] Hives   Tetracyclines & Related     "made me feel like i was going crazy"    Past Medical History:  Diagnosis Date   Asthma    well controlled.Advair  inhaler and Proventil prn   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   CP (cerebral palsy) (HCC)    Left side affected   Cyst of brain    Depression    Zoloft    Fibromyalgia    Gestational diabetes    On Glyburide   Hypothyroidism    On Synthroid   Postpartum depression 09/29/2012     Past Surgical History:  Procedure Laterality Date   CARDIAC SURGERY     CESAREAN SECTION     CESAREAN SECTION WITH BILATERAL TUBAL LIGATION Bilateral 09/07/2012   Procedure: CESAREAN SECTION WITH BILATERAL TUBAL LIGATION;  Surgeon: Lazaro Arms, MD;  Location: [facility];  Service: Obstetrics;  Laterality: Bilateral;  Repeat    NASAL SINUS SURGERY      Family History  Problem Relation Age of Onset   Hypertension Mother    COPD Mother    Diabetes Father    Diabetes Paternal Grandmother     Social History   Tobacco Use   Smoking status: Former    Types: Cigarettes   Smokeless tobacco: Never  Substance Use Topics   Alcohol use: No   Drug use: No    ROS   Objective:   Vitals: BP 111/67 (BP Location: Right Arm)   Pulse (!) 102   Temp 99.8 F (37.7 C) (Oral)   Resp 18  LMP 12/15/2020   SpO2 98%   Physical Exam Constitutional:      General: She is not in acute distress.    Appearance: Normal appearance. She is well-developed. She is not ill-appearing, toxic-appearing or diaphoretic.  HENT:     Head: Normocephalic and atraumatic.     Nose: Nose normal.     Mouth/Throat:     Mouth: Mucous membranes are moist.     Pharynx: No pharyngeal swelling, oropharyngeal exudate, posterior oropharyngeal erythema or uvula swelling.     Tonsils: No tonsillar exudate or tonsillar abscesses. 0 on the right. 0 on the left.  Eyes:     General: No scleral icterus.       Right eye: No discharge.        Left eye: No discharge.     Extraocular Movements: Extraocular movements intact.     Conjunctiva/sclera: Conjunctivae normal.     Pupils:  Pupils are equal, round, and reactive to light.  Cardiovascular:     Rate and Rhythm: Normal rate.  Pulmonary:     Effort: Pulmonary effort is normal.  Skin:    General: Skin is warm and dry.  Neurological:     General: No focal deficit present.     Mental Status: She is alert and oriented to person, place, and time.     Cranial Nerves: No cranial nerve deficit.     Motor: No weakness.     Coordination: Coordination normal.     Gait: Gait normal.  Psychiatric:        Mood and Affect: Mood normal.        Behavior: Behavior normal.        Thought Content: Thought content normal.        Judgment: Judgment normal.    Results for orders placed or performed during the hospital encounter of 12/22/20 (from the past 24 hour(s))  POCT rapid strep A     Status: None   Collection Time: 12/22/20  3:46 PM  Result Value Ref Range   Rapid Strep A Screen Negative Negative    Assessment and Plan :   PDMP not reviewed this encounter.  1. Viral syndrome   2. Body aches   3. Throat pain     Will manage for viral illness such as viral URI, viral syndrome, viral rhinitis, COVID-19, viral pharyngitis, influenza. Counseled patient on nature of COVID-19 including modes of transmission, diagnostic testing, management and supportive care.  Offered scripts for symptomatic relief. COVID 19, influenza and strep culture are pending. Counseled patient on potential for adverse effects with medications prescribed/recommended today, ER and return-to-clinic precautions discussed, patient verbalized understanding.     Wallis Bamberg, PA-C 12/22/20 1556

## 2020-12-22 NOTE — ED Triage Notes (Signed)
Pt presents with complaints of sore throat, cold chills, low grade fever, fatigue, headache, and body aches since Friday.

## 2020-12-24 LAB — COVID-19, FLU A+B NAA
Influenza A, NAA: NOT DETECTED
Influenza B, NAA: NOT DETECTED
SARS-CoV-2, NAA: NOT DETECTED

## 2020-12-24 LAB — CULTURE, GROUP A STREP (THRC)

## 2020-12-25 ENCOUNTER — Telehealth (HOSPITAL_COMMUNITY): Payer: Self-pay | Admitting: Emergency Medicine

## 2020-12-25 MED ORDER — PENICILLIN V POTASSIUM 500 MG PO TABS: 500.0000 mg | ORAL_TABLET | Freq: Two times a day (BID) | ORAL | 0 refills | Status: AC

## 2020-12-31 DIAGNOSIS — E782 Mixed hyperlipidemia: Secondary | ICD-10-CM | POA: Diagnosis not present

## 2020-12-31 DIAGNOSIS — Z7901 Long term (current) use of anticoagulants: Secondary | ICD-10-CM | POA: Diagnosis not present

## 2020-12-31 DIAGNOSIS — G629 Polyneuropathy, unspecified: Secondary | ICD-10-CM | POA: Diagnosis not present

## 2020-12-31 DIAGNOSIS — F3341 Major depressive disorder, recurrent, in partial remission: Secondary | ICD-10-CM | POA: Diagnosis not present

## 2020-12-31 DIAGNOSIS — E039 Hypothyroidism, unspecified: Secondary | ICD-10-CM | POA: Diagnosis not present

## 2020-12-31 DIAGNOSIS — Z79891 Long term (current) use of opiate analgesic: Secondary | ICD-10-CM | POA: Diagnosis not present

## 2020-12-31 DIAGNOSIS — M797 Fibromyalgia: Secondary | ICD-10-CM | POA: Diagnosis not present

## 2020-12-31 DIAGNOSIS — I10 Essential (primary) hypertension: Secondary | ICD-10-CM | POA: Diagnosis not present

## 2020-12-31 DIAGNOSIS — D649 Anemia, unspecified: Secondary | ICD-10-CM | POA: Diagnosis not present

## 2020-12-31 DIAGNOSIS — F419 Anxiety disorder, unspecified: Secondary | ICD-10-CM | POA: Diagnosis not present

## 2020-12-31 DIAGNOSIS — G808 Other cerebral palsy: Secondary | ICD-10-CM | POA: Diagnosis not present

## 2020-12-31 DIAGNOSIS — D509 Iron deficiency anemia, unspecified: Secondary | ICD-10-CM | POA: Diagnosis not present

## 2021-01-07 DIAGNOSIS — M797 Fibromyalgia: Secondary | ICD-10-CM | POA: Diagnosis not present

## 2021-01-07 DIAGNOSIS — E039 Hypothyroidism, unspecified: Secondary | ICD-10-CM | POA: Diagnosis not present

## 2021-01-07 DIAGNOSIS — F419 Anxiety disorder, unspecified: Secondary | ICD-10-CM | POA: Diagnosis not present

## 2021-01-07 DIAGNOSIS — I10 Essential (primary) hypertension: Secondary | ICD-10-CM | POA: Diagnosis not present

## 2021-01-07 DIAGNOSIS — Z23 Encounter for immunization: Secondary | ICD-10-CM | POA: Diagnosis not present

## 2021-03-18 DIAGNOSIS — H5213 Myopia, bilateral: Secondary | ICD-10-CM | POA: Diagnosis not present

## 2021-03-18 DIAGNOSIS — H5203 Hypermetropia, bilateral: Secondary | ICD-10-CM | POA: Diagnosis not present

## 2021-04-01 DIAGNOSIS — G629 Polyneuropathy, unspecified: Secondary | ICD-10-CM | POA: Diagnosis not present

## 2021-04-01 DIAGNOSIS — E039 Hypothyroidism, unspecified: Secondary | ICD-10-CM | POA: Diagnosis not present

## 2021-04-01 DIAGNOSIS — M797 Fibromyalgia: Secondary | ICD-10-CM | POA: Diagnosis not present

## 2021-04-01 DIAGNOSIS — I10 Essential (primary) hypertension: Secondary | ICD-10-CM | POA: Diagnosis not present

## 2021-04-01 DIAGNOSIS — D509 Iron deficiency anemia, unspecified: Secondary | ICD-10-CM | POA: Diagnosis not present

## 2021-04-01 DIAGNOSIS — G808 Other cerebral palsy: Secondary | ICD-10-CM | POA: Diagnosis not present

## 2021-04-01 DIAGNOSIS — D649 Anemia, unspecified: Secondary | ICD-10-CM | POA: Diagnosis not present

## 2021-04-01 DIAGNOSIS — F419 Anxiety disorder, unspecified: Secondary | ICD-10-CM | POA: Diagnosis not present

## 2021-04-01 DIAGNOSIS — Z79891 Long term (current) use of opiate analgesic: Secondary | ICD-10-CM | POA: Diagnosis not present

## 2021-04-01 DIAGNOSIS — E782 Mixed hyperlipidemia: Secondary | ICD-10-CM | POA: Diagnosis not present

## 2021-04-01 DIAGNOSIS — Z7901 Long term (current) use of anticoagulants: Secondary | ICD-10-CM | POA: Diagnosis not present

## 2021-04-01 DIAGNOSIS — F3341 Major depressive disorder, recurrent, in partial remission: Secondary | ICD-10-CM | POA: Diagnosis not present

## 2021-04-13 DIAGNOSIS — E782 Mixed hyperlipidemia: Secondary | ICD-10-CM | POA: Diagnosis not present

## 2021-04-13 DIAGNOSIS — M797 Fibromyalgia: Secondary | ICD-10-CM | POA: Diagnosis not present

## 2021-04-13 DIAGNOSIS — E039 Hypothyroidism, unspecified: Secondary | ICD-10-CM | POA: Diagnosis not present

## 2021-04-13 DIAGNOSIS — I10 Essential (primary) hypertension: Secondary | ICD-10-CM | POA: Diagnosis not present

## 2021-06-03 DIAGNOSIS — I10 Essential (primary) hypertension: Secondary | ICD-10-CM | POA: Diagnosis not present

## 2021-06-03 DIAGNOSIS — D509 Iron deficiency anemia, unspecified: Secondary | ICD-10-CM | POA: Diagnosis not present

## 2021-06-03 DIAGNOSIS — G808 Other cerebral palsy: Secondary | ICD-10-CM | POA: Diagnosis not present

## 2021-06-03 DIAGNOSIS — Z79891 Long term (current) use of opiate analgesic: Secondary | ICD-10-CM | POA: Diagnosis not present

## 2021-06-03 DIAGNOSIS — E039 Hypothyroidism, unspecified: Secondary | ICD-10-CM | POA: Diagnosis not present

## 2021-06-03 DIAGNOSIS — E782 Mixed hyperlipidemia: Secondary | ICD-10-CM | POA: Diagnosis not present

## 2021-06-03 DIAGNOSIS — G629 Polyneuropathy, unspecified: Secondary | ICD-10-CM | POA: Diagnosis not present

## 2021-06-03 DIAGNOSIS — Z7901 Long term (current) use of anticoagulants: Secondary | ICD-10-CM | POA: Diagnosis not present

## 2021-06-03 DIAGNOSIS — D649 Anemia, unspecified: Secondary | ICD-10-CM | POA: Diagnosis not present

## 2021-06-03 DIAGNOSIS — F419 Anxiety disorder, unspecified: Secondary | ICD-10-CM | POA: Diagnosis not present

## 2021-06-03 DIAGNOSIS — F3341 Major depressive disorder, recurrent, in partial remission: Secondary | ICD-10-CM | POA: Diagnosis not present

## 2021-06-03 DIAGNOSIS — M797 Fibromyalgia: Secondary | ICD-10-CM | POA: Diagnosis not present

## 2021-07-10 DIAGNOSIS — J4520 Mild intermittent asthma, uncomplicated: Secondary | ICD-10-CM | POA: Diagnosis not present

## 2021-07-10 DIAGNOSIS — M797 Fibromyalgia: Secondary | ICD-10-CM | POA: Diagnosis not present

## 2021-07-10 DIAGNOSIS — E039 Hypothyroidism, unspecified: Secondary | ICD-10-CM | POA: Diagnosis not present

## 2021-07-10 DIAGNOSIS — I10 Essential (primary) hypertension: Secondary | ICD-10-CM | POA: Diagnosis not present

## 2021-07-13 DIAGNOSIS — J4520 Mild intermittent asthma, uncomplicated: Secondary | ICD-10-CM | POA: Diagnosis not present

## 2021-07-30 DIAGNOSIS — F3341 Major depressive disorder, recurrent, in partial remission: Secondary | ICD-10-CM | POA: Diagnosis not present

## 2021-07-30 DIAGNOSIS — F419 Anxiety disorder, unspecified: Secondary | ICD-10-CM | POA: Diagnosis not present

## 2021-07-30 DIAGNOSIS — D649 Anemia, unspecified: Secondary | ICD-10-CM | POA: Diagnosis not present

## 2021-07-30 DIAGNOSIS — Z79891 Long term (current) use of opiate analgesic: Secondary | ICD-10-CM | POA: Diagnosis not present

## 2021-07-30 DIAGNOSIS — E782 Mixed hyperlipidemia: Secondary | ICD-10-CM | POA: Diagnosis not present

## 2021-07-30 DIAGNOSIS — M797 Fibromyalgia: Secondary | ICD-10-CM | POA: Diagnosis not present

## 2021-07-30 DIAGNOSIS — E039 Hypothyroidism, unspecified: Secondary | ICD-10-CM | POA: Diagnosis not present

## 2021-07-30 DIAGNOSIS — I10 Essential (primary) hypertension: Secondary | ICD-10-CM | POA: Diagnosis not present

## 2021-07-30 DIAGNOSIS — Z7901 Long term (current) use of anticoagulants: Secondary | ICD-10-CM | POA: Diagnosis not present

## 2021-07-30 DIAGNOSIS — G808 Other cerebral palsy: Secondary | ICD-10-CM | POA: Diagnosis not present

## 2021-07-30 DIAGNOSIS — D509 Iron deficiency anemia, unspecified: Secondary | ICD-10-CM | POA: Diagnosis not present

## 2021-07-30 DIAGNOSIS — G629 Polyneuropathy, unspecified: Secondary | ICD-10-CM | POA: Diagnosis not present

## 2021-09-26 IMAGING — MG MM DIGITAL DIAGNOSTIC UNILAT*R* W/ TOMO W/ CAD
5 series · 6 of 9 positions shown · non-contrast
Comparison: Previous exams.

CLINICAL DATA: Screening recall for right breast calcifications.

EXAM:
DIGITAL DIAGNOSTIC UNILATERAL RIGHT MAMMOGRAM WITH TOMOSYNTHESIS AND
CAD
TECHNIQUE: Right digital diagnostic mammography and breast tomosynthesis was
performed. The images were evaluated with computer-aided detection.

[R CC (1 of 2)]
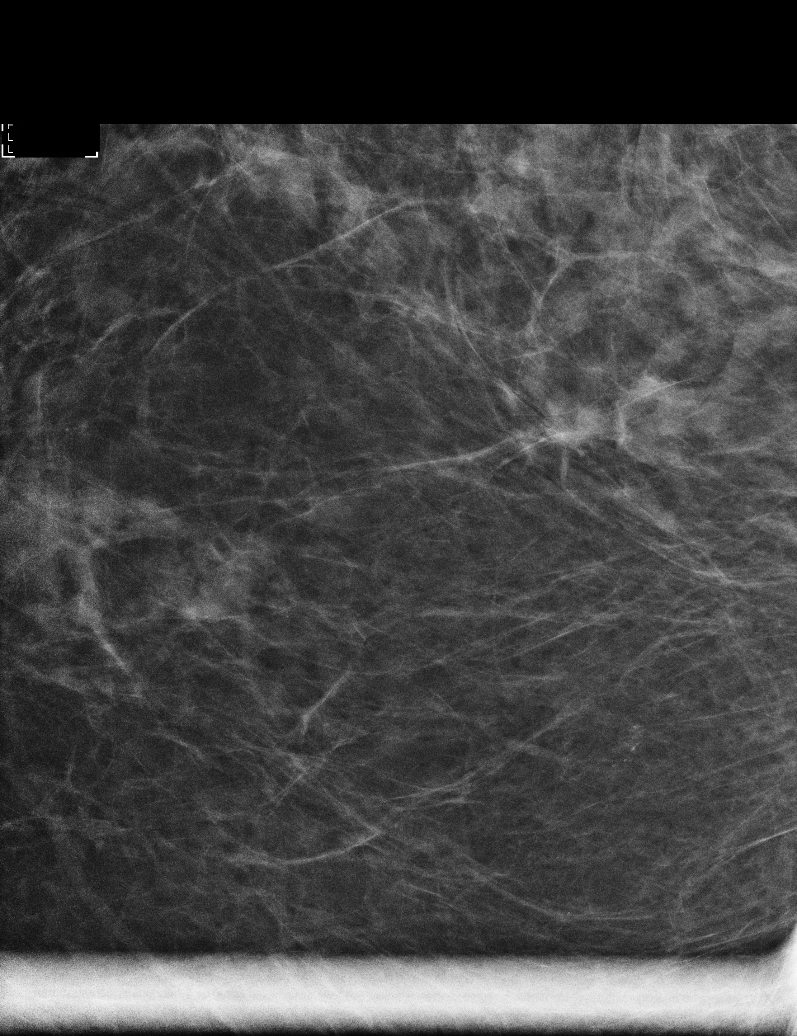

[R ML]
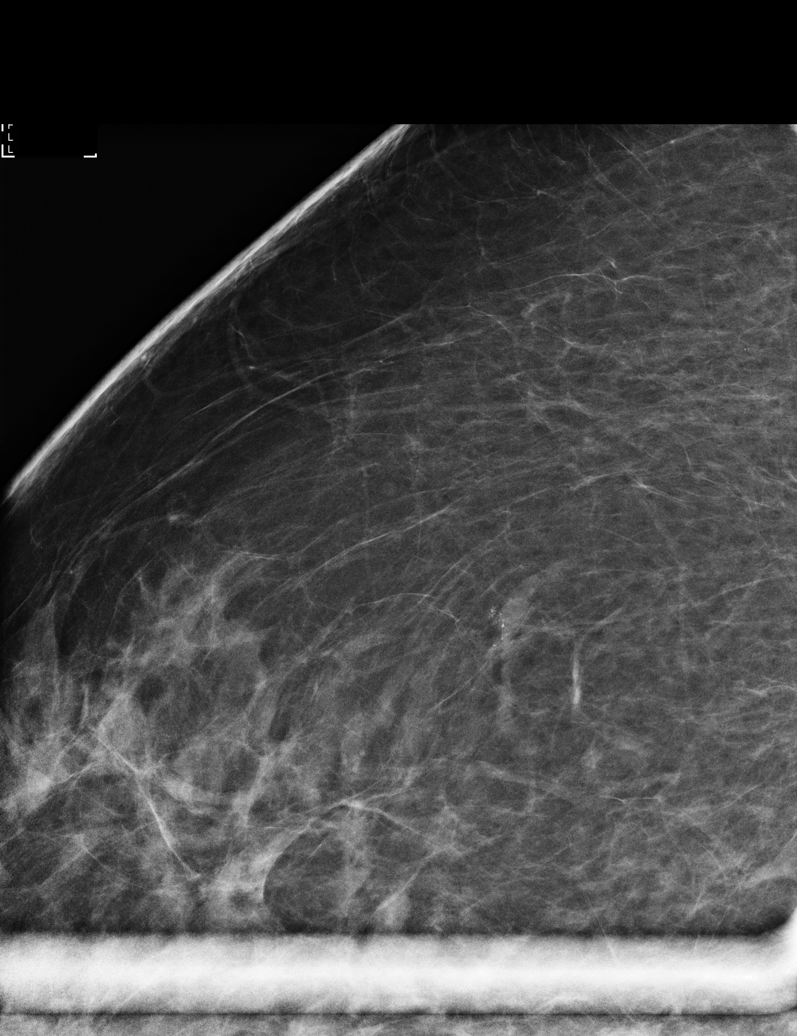

[R CC (2 of 2)]
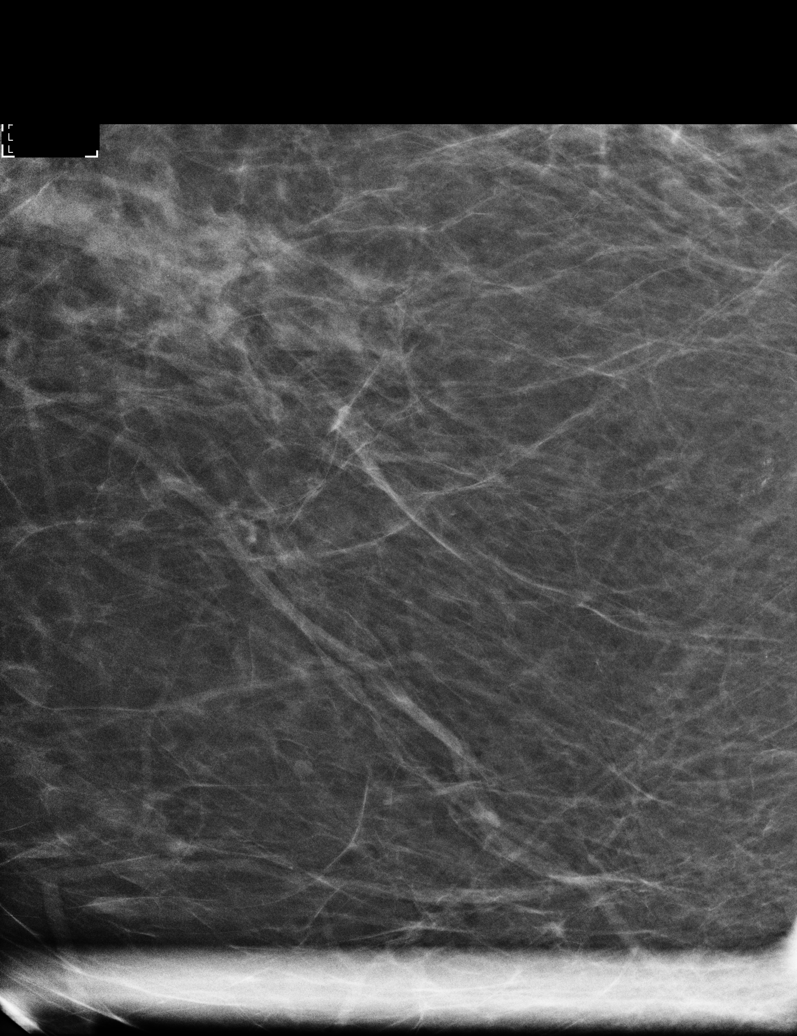

[R ML synth-2D]
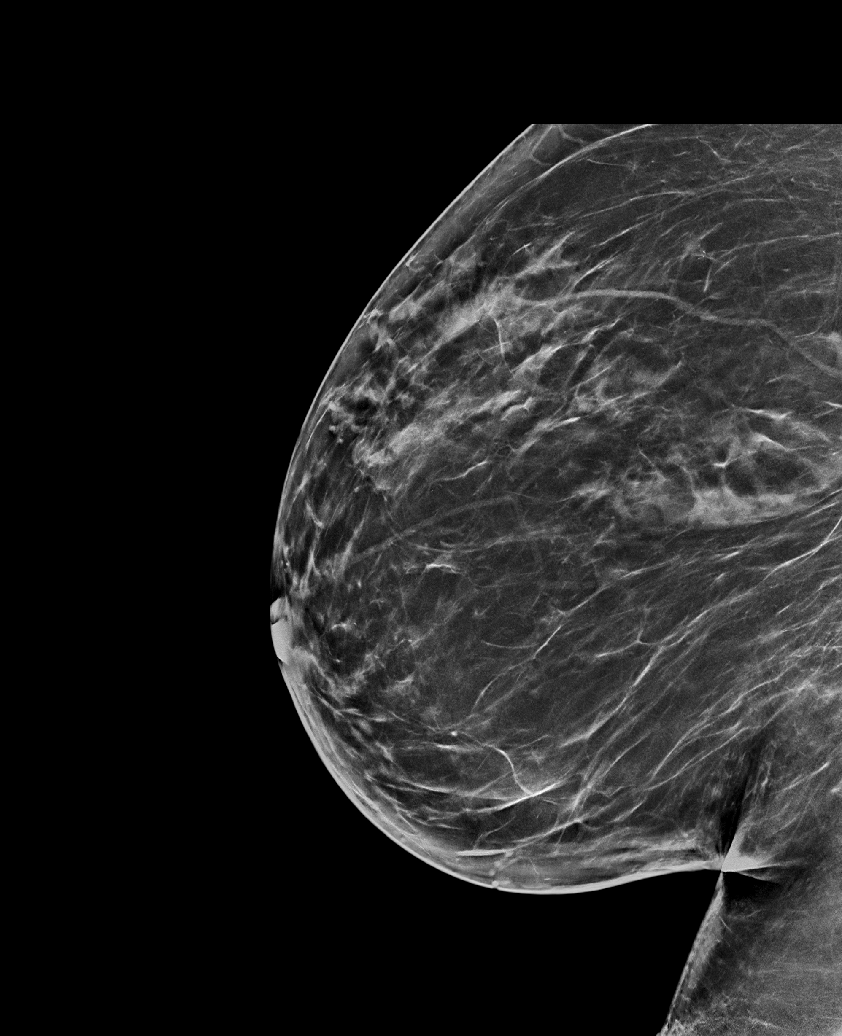

[R ML tomo · 2 of 84 frames shown]
[frame 28/84]
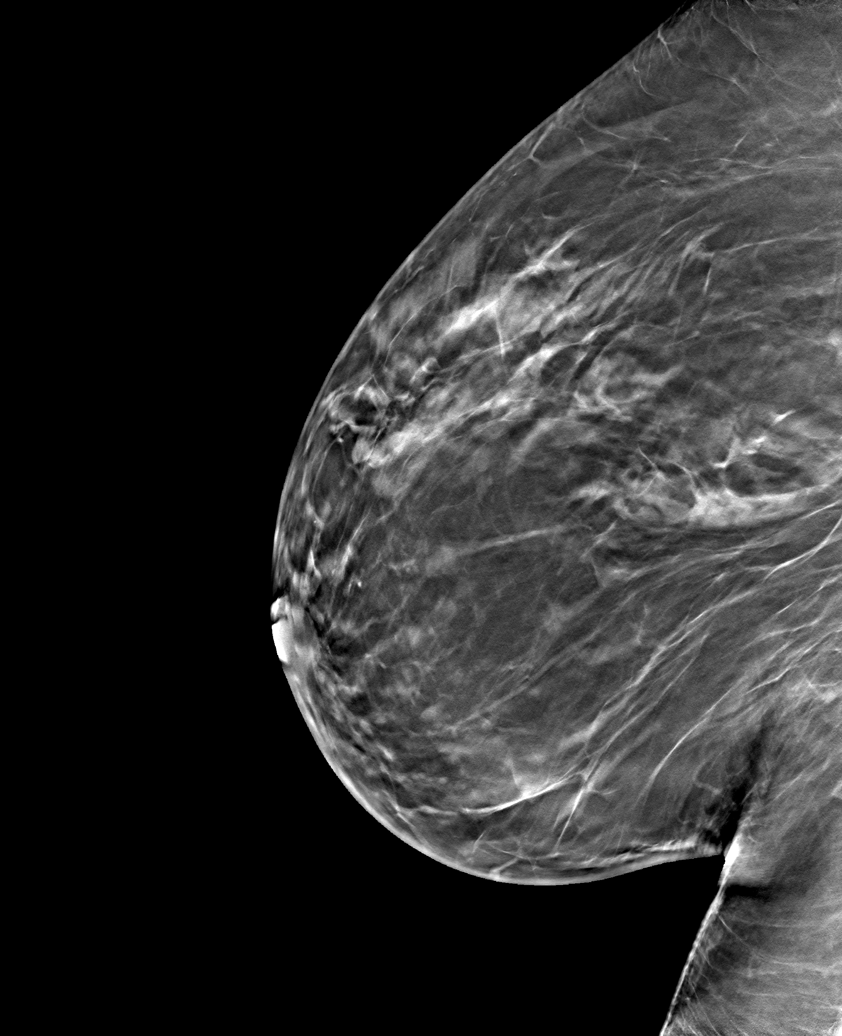
[frame 43/84]
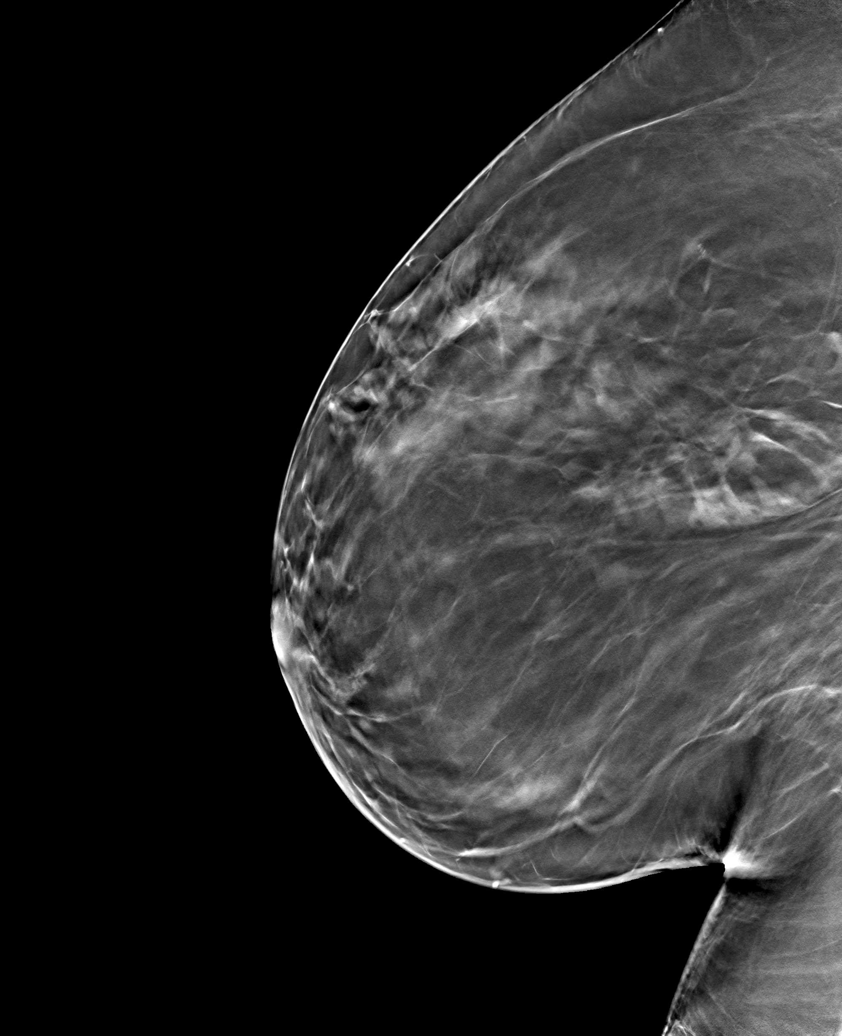

[6 of 9 positions shown; findings below may reference images not displayed]

ACR Breast Density Category b: There are scattered areas of
fibroglandular density.
FINDINGS: Spot compression magnification views were performed over the upper
slightly inner far posterior right breast demonstrating a group of
mixed punctate and amorphous calcifications spanning 0.5 cm.
IMPRESSION: Indeterminate 0.5 cm group of calcifications in the upper slightly
inner posterior right breast.

RECOMMENDATION:
Recommend stereotactic guided biopsy of the calcifications in the
right breast.

I have discussed the findings and recommendations with the patient.
If applicable, a reminder letter will be sent to the patient
regarding the next appointment.

BI-RADS CATEGORY  4: Suspicious.

## 2021-10-06 DIAGNOSIS — Z87891 Personal history of nicotine dependence: Secondary | ICD-10-CM | POA: Diagnosis not present

## 2021-10-06 DIAGNOSIS — E039 Hypothyroidism, unspecified: Secondary | ICD-10-CM | POA: Diagnosis not present

## 2021-10-06 DIAGNOSIS — Z6379 Other stressful life events affecting family and household: Secondary | ICD-10-CM | POA: Diagnosis not present

## 2021-10-06 DIAGNOSIS — M25579 Pain in unspecified ankle and joints of unspecified foot: Secondary | ICD-10-CM | POA: Diagnosis not present

## 2021-10-21 DIAGNOSIS — E039 Hypothyroidism, unspecified: Secondary | ICD-10-CM | POA: Diagnosis not present

## 2021-10-21 DIAGNOSIS — F419 Anxiety disorder, unspecified: Secondary | ICD-10-CM | POA: Diagnosis not present

## 2021-10-21 DIAGNOSIS — G629 Polyneuropathy, unspecified: Secondary | ICD-10-CM | POA: Diagnosis not present

## 2021-10-21 DIAGNOSIS — G808 Other cerebral palsy: Secondary | ICD-10-CM | POA: Diagnosis not present

## 2021-10-21 DIAGNOSIS — E782 Mixed hyperlipidemia: Secondary | ICD-10-CM | POA: Diagnosis not present

## 2021-10-21 DIAGNOSIS — Z79891 Long term (current) use of opiate analgesic: Secondary | ICD-10-CM | POA: Diagnosis not present

## 2021-10-21 DIAGNOSIS — M797 Fibromyalgia: Secondary | ICD-10-CM | POA: Diagnosis not present

## 2021-10-21 DIAGNOSIS — F3341 Major depressive disorder, recurrent, in partial remission: Secondary | ICD-10-CM | POA: Diagnosis not present

## 2021-10-21 DIAGNOSIS — I10 Essential (primary) hypertension: Secondary | ICD-10-CM | POA: Diagnosis not present

## 2021-10-21 DIAGNOSIS — D509 Iron deficiency anemia, unspecified: Secondary | ICD-10-CM | POA: Diagnosis not present

## 2021-10-21 DIAGNOSIS — Z7901 Long term (current) use of anticoagulants: Secondary | ICD-10-CM | POA: Diagnosis not present

## 2021-10-21 DIAGNOSIS — D649 Anemia, unspecified: Secondary | ICD-10-CM | POA: Diagnosis not present

## 2021-12-07 DIAGNOSIS — E039 Hypothyroidism, unspecified: Secondary | ICD-10-CM | POA: Diagnosis not present

## 2022-01-05 DIAGNOSIS — E039 Hypothyroidism, unspecified: Secondary | ICD-10-CM | POA: Diagnosis not present

## 2022-01-05 DIAGNOSIS — E782 Mixed hyperlipidemia: Secondary | ICD-10-CM | POA: Diagnosis not present

## 2022-01-05 DIAGNOSIS — I10 Essential (primary) hypertension: Secondary | ICD-10-CM | POA: Diagnosis not present

## 2022-01-05 DIAGNOSIS — F419 Anxiety disorder, unspecified: Secondary | ICD-10-CM | POA: Diagnosis not present

## 2022-01-19 DIAGNOSIS — F3341 Major depressive disorder, recurrent, in partial remission: Secondary | ICD-10-CM | POA: Diagnosis not present

## 2022-01-19 DIAGNOSIS — F419 Anxiety disorder, unspecified: Secondary | ICD-10-CM | POA: Diagnosis not present

## 2022-01-19 DIAGNOSIS — I10 Essential (primary) hypertension: Secondary | ICD-10-CM | POA: Diagnosis not present

## 2022-01-19 DIAGNOSIS — Z7901 Long term (current) use of anticoagulants: Secondary | ICD-10-CM | POA: Diagnosis not present

## 2022-01-19 DIAGNOSIS — D649 Anemia, unspecified: Secondary | ICD-10-CM | POA: Diagnosis not present

## 2022-01-19 DIAGNOSIS — D509 Iron deficiency anemia, unspecified: Secondary | ICD-10-CM | POA: Diagnosis not present

## 2022-01-19 DIAGNOSIS — G808 Other cerebral palsy: Secondary | ICD-10-CM | POA: Diagnosis not present

## 2022-01-19 DIAGNOSIS — Z79891 Long term (current) use of opiate analgesic: Secondary | ICD-10-CM | POA: Diagnosis not present

## 2022-01-19 DIAGNOSIS — E039 Hypothyroidism, unspecified: Secondary | ICD-10-CM | POA: Diagnosis not present

## 2022-01-19 DIAGNOSIS — G629 Polyneuropathy, unspecified: Secondary | ICD-10-CM | POA: Diagnosis not present

## 2022-01-19 DIAGNOSIS — E782 Mixed hyperlipidemia: Secondary | ICD-10-CM | POA: Diagnosis not present

## 2022-01-19 DIAGNOSIS — M797 Fibromyalgia: Secondary | ICD-10-CM | POA: Diagnosis not present

## 2022-03-03 DIAGNOSIS — F419 Anxiety disorder, unspecified: Secondary | ICD-10-CM | POA: Diagnosis not present

## 2022-03-03 DIAGNOSIS — M797 Fibromyalgia: Secondary | ICD-10-CM | POA: Diagnosis not present

## 2022-03-03 DIAGNOSIS — I10 Essential (primary) hypertension: Secondary | ICD-10-CM | POA: Diagnosis not present

## 2022-03-03 DIAGNOSIS — D649 Anemia, unspecified: Secondary | ICD-10-CM | POA: Diagnosis not present

## 2022-03-03 DIAGNOSIS — G629 Polyneuropathy, unspecified: Secondary | ICD-10-CM | POA: Diagnosis not present

## 2022-03-03 DIAGNOSIS — Z7901 Long term (current) use of anticoagulants: Secondary | ICD-10-CM | POA: Diagnosis not present

## 2022-03-03 DIAGNOSIS — D509 Iron deficiency anemia, unspecified: Secondary | ICD-10-CM | POA: Diagnosis not present

## 2022-03-03 DIAGNOSIS — G808 Other cerebral palsy: Secondary | ICD-10-CM | POA: Diagnosis not present

## 2022-03-03 DIAGNOSIS — E039 Hypothyroidism, unspecified: Secondary | ICD-10-CM | POA: Diagnosis not present

## 2022-03-03 DIAGNOSIS — F3341 Major depressive disorder, recurrent, in partial remission: Secondary | ICD-10-CM | POA: Diagnosis not present

## 2022-03-03 DIAGNOSIS — E782 Mixed hyperlipidemia: Secondary | ICD-10-CM | POA: Diagnosis not present

## 2022-03-03 DIAGNOSIS — Z79891 Long term (current) use of opiate analgesic: Secondary | ICD-10-CM | POA: Diagnosis not present

## 2022-04-09 DIAGNOSIS — F3341 Major depressive disorder, recurrent, in partial remission: Secondary | ICD-10-CM | POA: Diagnosis not present

## 2022-04-09 DIAGNOSIS — E039 Hypothyroidism, unspecified: Secondary | ICD-10-CM | POA: Diagnosis not present

## 2022-04-09 DIAGNOSIS — G629 Polyneuropathy, unspecified: Secondary | ICD-10-CM | POA: Diagnosis not present

## 2022-04-09 DIAGNOSIS — M797 Fibromyalgia: Secondary | ICD-10-CM | POA: Diagnosis not present

## 2022-04-21 DIAGNOSIS — D509 Iron deficiency anemia, unspecified: Secondary | ICD-10-CM | POA: Diagnosis not present

## 2022-04-21 DIAGNOSIS — I951 Orthostatic hypotension: Secondary | ICD-10-CM | POA: Diagnosis not present

## 2022-04-23 DIAGNOSIS — D509 Iron deficiency anemia, unspecified: Secondary | ICD-10-CM | POA: Diagnosis not present

## 2022-04-23 DIAGNOSIS — I10 Essential (primary) hypertension: Secondary | ICD-10-CM | POA: Diagnosis not present

## 2022-04-23 DIAGNOSIS — Z79899 Other long term (current) drug therapy: Secondary | ICD-10-CM | POA: Diagnosis not present

## 2022-04-23 DIAGNOSIS — E039 Hypothyroidism, unspecified: Secondary | ICD-10-CM | POA: Diagnosis not present

## 2022-05-07 DIAGNOSIS — D509 Iron deficiency anemia, unspecified: Secondary | ICD-10-CM | POA: Diagnosis not present

## 2022-05-07 DIAGNOSIS — Z79899 Other long term (current) drug therapy: Secondary | ICD-10-CM | POA: Diagnosis not present

## 2022-05-07 DIAGNOSIS — F3341 Major depressive disorder, recurrent, in partial remission: Secondary | ICD-10-CM | POA: Diagnosis not present

## 2022-05-07 DIAGNOSIS — I10 Essential (primary) hypertension: Secondary | ICD-10-CM | POA: Diagnosis not present

## 2022-05-25 DIAGNOSIS — I951 Orthostatic hypotension: Secondary | ICD-10-CM | POA: Diagnosis not present

## 2022-05-25 DIAGNOSIS — Z79899 Other long term (current) drug therapy: Secondary | ICD-10-CM | POA: Diagnosis not present

## 2022-05-25 DIAGNOSIS — E782 Mixed hyperlipidemia: Secondary | ICD-10-CM | POA: Diagnosis not present

## 2022-05-25 DIAGNOSIS — E6609 Other obesity due to excess calories: Secondary | ICD-10-CM | POA: Diagnosis not present

## 2022-05-25 DIAGNOSIS — E039 Hypothyroidism, unspecified: Secondary | ICD-10-CM | POA: Diagnosis not present

## 2022-05-25 DIAGNOSIS — D509 Iron deficiency anemia, unspecified: Secondary | ICD-10-CM | POA: Diagnosis not present

## 2022-05-25 DIAGNOSIS — I10 Essential (primary) hypertension: Secondary | ICD-10-CM | POA: Diagnosis not present

## 2022-06-07 DIAGNOSIS — M797 Fibromyalgia: Secondary | ICD-10-CM | POA: Diagnosis not present

## 2022-06-07 DIAGNOSIS — Z79899 Other long term (current) drug therapy: Secondary | ICD-10-CM | POA: Diagnosis not present

## 2022-06-07 DIAGNOSIS — D509 Iron deficiency anemia, unspecified: Secondary | ICD-10-CM | POA: Diagnosis not present

## 2022-06-07 DIAGNOSIS — E039 Hypothyroidism, unspecified: Secondary | ICD-10-CM | POA: Diagnosis not present

## 2022-06-11 ENCOUNTER — Encounter: Payer: Self-pay | Admitting: *Deleted

## 2022-07-30 DIAGNOSIS — E663 Overweight: Secondary | ICD-10-CM | POA: Diagnosis not present

## 2022-07-30 DIAGNOSIS — U071 COVID-19: Secondary | ICD-10-CM | POA: Diagnosis not present

## 2022-07-30 DIAGNOSIS — Z6829 Body mass index (BMI) 29.0-29.9, adult: Secondary | ICD-10-CM | POA: Diagnosis not present

## 2022-09-10 ENCOUNTER — Ambulatory Visit: Payer: Medicaid Other | Admitting: Family Medicine

## 2022-09-10 ENCOUNTER — Encounter: Payer: Self-pay | Admitting: Family Medicine

## 2022-09-10 VITALS — BP 121/83 | HR 89 | Ht 68.0 in | Wt 201.0 lb

## 2022-09-10 DIAGNOSIS — Z1159 Encounter for screening for other viral diseases: Secondary | ICD-10-CM

## 2022-09-10 DIAGNOSIS — E559 Vitamin D deficiency, unspecified: Secondary | ICD-10-CM

## 2022-09-10 DIAGNOSIS — Z1329 Encounter for screening for other suspected endocrine disorder: Secondary | ICD-10-CM

## 2022-09-10 DIAGNOSIS — F419 Anxiety disorder, unspecified: Secondary | ICD-10-CM | POA: Diagnosis not present

## 2022-09-10 DIAGNOSIS — E039 Hypothyroidism, unspecified: Secondary | ICD-10-CM | POA: Diagnosis not present

## 2022-09-10 DIAGNOSIS — Z131 Encounter for screening for diabetes mellitus: Secondary | ICD-10-CM

## 2022-09-10 DIAGNOSIS — Z13220 Encounter for screening for lipoid disorders: Secondary | ICD-10-CM | POA: Diagnosis not present

## 2022-09-10 DIAGNOSIS — F339 Major depressive disorder, recurrent, unspecified: Secondary | ICD-10-CM

## 2022-09-10 DIAGNOSIS — I10 Essential (primary) hypertension: Secondary | ICD-10-CM | POA: Diagnosis not present

## 2022-09-10 MED ORDER — AMITRIPTYLINE HCL 10 MG PO TABS: 10.0000 mg | ORAL_TABLET | Freq: Every day | ORAL | 1 refills | Status: AC

## 2022-09-10 NOTE — Assessment & Plan Note (Signed)
Flowsheet Row Office Visit from 09/10/2022 in [facility]  PHQ-9 Total Score 11      Patient reported has taking Zoloft, Celexa and Prozac and Effexor in the past with no relief.  Trial on amitriptyline 10 mg at bedtime Continued discussion on lifestyle changes, establishing a daily routine, going outdoors, exercise, healthy eating habits, mindfulness and mediatation.   Referral to behavioral therapy   Patient verbally consented to [facility] services about presenting concerns and psychiatric consultation as appropriate. The services will be billed as appropriate for the patient.

## 2022-09-10 NOTE — Patient Instructions (Signed)

## 2022-09-10 NOTE — Assessment & Plan Note (Signed)
    09/10/2022    8:18 AM  GAD 7 : Generalized Anxiety Score  Nervous, Anxious, on Edge 2  Control/stop worrying 1  Worry too much - different things 2  Trouble relaxing 0  Restless 0  Easily annoyed or irritable 3  Afraid - awful might happen 0  Total GAD 7 Score 8  Anxiety Difficulty Somewhat difficult   Initial visit Xanax 1 mg  for the past few years

## 2022-09-10 NOTE — Progress Notes (Signed)
New Patient Office Visit   Subjective   Patient ID: [name], female    DOB: 04-17-1975  Age: 47 y.o. MRN: [id number]  CC:  Chief Complaint  Patient presents with   Establish Care   Depression    Patient is here to establish care. Wants to discuss meds for depression. States she was on celexa, but wasn't taking it for a while and when she started again it made her nauseous.     HPI [name] 47 year old female, presents to establish care. She  has a past medical history of Asthma, Cardiac abnormality (1979), CP (cerebral palsy) (HCC), Cyst of brain, Depression, Fibromyalgia, Hypothyroidism, and Postpartum depression (09/29/2012).  Depression        This is a chronic problem for the past few years and occurs constantly. Depression symptoms is unchanged since onset. Associated symptoms include decreased interest and sad. Associated symptoms include no suicidal ideas.The symptoms are aggravated by family issues. Past treatments include SSRIs - Selective serotonin reuptake inhibitors and SNRIs - Serotonin and norepinephrine reuptake inhibitors. Previous treatment provided no relief relief.  Risk factors include history of mental illness and family history.  Past medical history includes thyroid problem and anxiety. Pertinent negatives include no suicide attempts.       Outpatient Encounter Medications as of 09/10/2022  Medication Sig   albuterol (PROVENTIL HFA;VENTOLIN HFA) 108 (90 BASE) MCG/ACT inhaler Inhale 2 puffs into the lungs every 4 (four) hours as needed for wheezing or shortness of breath.    albuterol (PROVENTIL) (2.5 MG/3ML) 0.083% nebulizer solution Take 5 mg by nebulization every 4 (four) hours as needed for wheezing or shortness of breath.    ALPRAZolam (XANAX) 1 MG tablet Take 1 mg by mouth 3 (three) times daily as needed for anxiety.   amitriptyline (ELAVIL) 10 MG tablet Take 1 tablet (10 mg total) by mouth at bedtime.   amphetamine-dextroamphetamine (ADDERALL  XR) 25 MG 24 hr capsule Take 25 mg by mouth every morning.   Fluticasone-Salmeterol (ADVAIR) 500-50 MCG/DOSE AEPB Inhale 1 puff into the lungs every 12 (twelve) hours.   hydrochlorothiazide (HYDRODIURIL) 12.5 MG tablet Take 12.5 mg by mouth daily.   levothyroxine (SYNTHROID) 300 MCG tablet Take 137 mcg by mouth daily.   methocarbamol (ROBAXIN) 500 MG tablet Take 1 tablet (500 mg total) by mouth 2 (two) times daily.   [DISCONTINUED] FLUoxetine (PROZAC) 20 MG capsule Take 20 mg by mouth daily. (Patient not taking: Reported on 09/10/2022)   [DISCONTINUED] naproxen (NAPROSYN) 500 MG tablet Take 1 tablet (500 mg total) by mouth 2 (two) times daily. (Patient not taking: Reported on 09/10/2022)   No facility-administered encounter medications on file as of 09/10/2022.    Past Surgical History:  Procedure Laterality Date   CARDIAC SURGERY     CESAREAN SECTION     CESAREAN SECTION WITH BILATERAL TUBAL LIGATION Bilateral 09/07/2012   Procedure: CESAREAN SECTION WITH BILATERAL TUBAL LIGATION;  Surgeon: Lazaro Arms, MD;  Location: [facility];  Service: Obstetrics;  Laterality: Bilateral;  Repeat    NASAL SINUS SURGERY      Review of Systems  Constitutional:  Negative for chills and fever.  Eyes:  Negative for blurred vision.  Respiratory:  Negative for shortness of breath.   Cardiovascular:  Negative for chest pain.  Gastrointestinal:  Negative for abdominal pain.  Genitourinary:  Negative for dysuria.  Musculoskeletal:  Negative for myalgias.  Skin:  Negative for rash.  Neurological:  Negative for dizziness.  Psychiatric/Behavioral:  Positive for depression. The patient is nervous/anxious.       Objective    BP 121/83   Pulse 89   Ht 5\' 8"  (1.727 m)   Wt 201 lb (91.2 kg)   SpO2 98%   BMI 30.56 kg/m   Physical Exam Vitals reviewed.  Constitutional:      General: She is not in acute distress.    Appearance: Normal appearance. She is not ill-appearing, toxic-appearing or diaphoretic.   HENT:     Head: Normocephalic.  Eyes:     General:        Right eye: No discharge.        Left eye: No discharge.     Conjunctiva/sclera: Conjunctivae normal.  Cardiovascular:     Rate and Rhythm: Normal rate.     Pulses: Normal pulses.     Heart sounds: Normal heart sounds.  Pulmonary:     Effort: Pulmonary effort is normal. No respiratory distress.     Breath sounds: Normal breath sounds.  Abdominal:     General: Bowel sounds are normal.     Palpations: Abdomen is soft.     Tenderness: There is no abdominal tenderness. There is no right CVA tenderness, left CVA tenderness or guarding.  Musculoskeletal:        General: Normal range of motion.     Cervical back: Normal range of motion.  Skin:    General: Skin is warm and dry.     Capillary Refill: Capillary refill takes less than 2 seconds.  Neurological:     General: No focal deficit present.     Mental Status: She is alert and oriented to person, place, and time.     Coordination: Coordination normal.     Gait: Gait normal.  Psychiatric:        Mood and Affect: Mood normal.        Behavior: Behavior normal.       Assessment & Plan:  Depression, recurrent [facility]) Assessment & Plan: Flowsheet Row Office Visit from 09/10/2022 in [facility]  PHQ-9 Total Score 11      Patient reported has taking Zoloft, Celexa and Prozac and Effexor in the past with no relief.  Trial on amitriptyline 10 mg at bedtime Continued discussion on lifestyle changes, establishing a daily routine, going outdoors, exercise, healthy eating habits, mindfulness and mediatation.   Referral to behavioral therapy   Patient verbally consented to [facility] services about presenting concerns and psychiatric consultation as appropriate. The services will be billed as appropriate for the patient.    Orders: -     Amitriptyline HCl; Take 1 tablet (10 mg total) by mouth at bedtime.  Dispense: 30 tablet;  Refill: 1 -     Ambulatory referral to Behavioral Health  Primary hypertension -     CBC with Differential/Platelet -     CMP14+EGFR -     Microalbumin / creatinine urine ratio  Screening for diabetes mellitus -     Hemoglobin A1c  Screening for lipid disorders -     Lipid panel  Screening for hypothyroidism  Acquired hypothyroidism -     TSH + free T4  Need for hepatitis C screening test -     Hepatitis C antibody  Vitamin D deficiency -     VITAMIN D 25 Hydroxy (Vit-D Deficiency, Fractures)  Anxiety Assessment & Plan:    09/10/2022    8:18 AM  GAD 7 : Generalized Anxiety Score  Nervous, Anxious, on Edge 2  Control/stop worrying 1  Worry too much - different things 2  Trouble relaxing 0  Restless 0  Easily annoyed or irritable 3  Afraid - awful might happen 0  Total GAD 7 Score 8  Anxiety Difficulty Somewhat difficult   Initial visit Xanax 1 mg  for the past few years      Return in about 6 weeks (around 10/22/2022), or if symptoms worsen or fail to improve, for Pap smear, Depression.   Cruzita Lederer Newman Nip, FNP

## 2022-09-11 LAB — CMP14+EGFR
ALT: 9 IU/L (ref 0–32)
Albumin: 4.4 g/dL (ref 3.9–4.9)
Alkaline Phosphatase: 83 IU/L (ref 44–121)
BUN: 15 mg/dL (ref 6–24)
Bilirubin Total: 0.3 mg/dL (ref 0.0–1.2)
Creatinine, Ser: 0.75 mg/dL (ref 0.57–1.00)
Glucose: 87 mg/dL (ref 70–99)
Potassium: 4.1 mmol/L (ref 3.5–5.2)
Total Protein: 7.7 g/dL (ref 6.0–8.5)
eGFR: 100 mL/min/{1.73_m2} (ref >59)

## 2022-09-11 LAB — CBC WITH DIFFERENTIAL/PLATELET
Hemoglobin: 12 g/dL (ref 11.1–15.9)
Lymphocytes Absolute: 2.8 10*3/uL (ref 0.7–3.1)
MCHC: 31.7 g/dL (ref 31.5–35.7)
Neutrophils: 51 %
RBC: 4.65 x10E6/uL (ref 3.77–5.28)
WBC: 8.9 10*3/uL (ref 3.4–10.8)

## 2022-09-11 LAB — TSH+FREE T4
Free T4: 1.77 ng/dL (ref 0.82–1.77)
TSH: 2.71 u[IU]/mL (ref 0.450–4.500)

## 2022-09-11 LAB — HEMOGLOBIN A1C: Hgb A1c MFr Bld: 5.6 % (ref 4.8–5.6)

## 2022-09-11 LAB — MICROALBUMIN / CREATININE URINE RATIO

## 2022-09-11 LAB — HEPATITIS C ANTIBODY: Hep C Virus Ab: NONREACTIVE

## 2022-09-11 LAB — LIPID PANEL: Chol/HDL Ratio: 3.7 ratio (ref 0.0–4.4)

## 2022-09-12 LAB — LIPID PANEL
Cholesterol, Total: 228 mg/dL — ABNORMAL HIGH (ref 100–199)
HDL: 62 mg/dL (ref >39)
LDL Chol Calc (NIH): 147 mg/dL — ABNORMAL HIGH (ref 0–99)
Triglycerides: 110 mg/dL (ref 0–149)
VLDL Cholesterol Cal: 19 mg/dL (ref 5–40)

## 2022-09-12 LAB — CBC WITH DIFFERENTIAL/PLATELET
Basophils Absolute: 0.1 10*3/uL (ref 0.0–0.2)
Basos: 1 %
EOS (ABSOLUTE): 0.5 10*3/uL — ABNORMAL HIGH (ref 0.0–0.4)
Eos: 6 %
Hematocrit: 37.8 % (ref 34.0–46.6)
Immature Grans (Abs): 0 10*3/uL (ref 0.0–0.1)
Immature Granulocytes: 0 %
Lymphs: 32 %
MCH: 25.8 pg — ABNORMAL LOW (ref 26.6–33.0)
MCV: 81 fL (ref 79–97)
Monocytes Absolute: 0.9 10*3/uL (ref 0.1–0.9)
Monocytes: 10 %
Neutrophils Absolute: 4.5 10*3/uL (ref 1.4–7.0)
Platelets: 388 10*3/uL (ref 150–450)
RDW: 13.7 % (ref 11.7–15.4)

## 2022-09-12 LAB — MICROALBUMIN / CREATININE URINE RATIO
Microalb/Creat Ratio: 2 mg/g creat (ref 0–29)
Microalbumin, Urine: 3.2 ug/mL

## 2022-09-12 LAB — CMP14+EGFR
AST: 15 IU/L (ref 0–40)
BUN/Creatinine Ratio: 20 (ref 9–23)
CO2: 20 mmol/L (ref 20–29)
Calcium: 9.1 mg/dL (ref 8.7–10.2)
Chloride: 104 mmol/L (ref 96–106)
Globulin, Total: 3.3 g/dL (ref 1.5–4.5)
Sodium: 141 mmol/L (ref 134–144)

## 2022-09-12 LAB — VITAMIN D 25 HYDROXY (VIT D DEFICIENCY, FRACTURES): Vit D, 25-Hydroxy: 23.2 ng/mL — ABNORMAL LOW (ref 30.0–100.0)

## 2022-09-12 LAB — HEMOGLOBIN A1C: Est. average glucose Bld gHb Est-mCnc: 114 mg/dL

## 2022-09-16 ENCOUNTER — Other Ambulatory Visit: Payer: Self-pay | Admitting: Family Medicine

## 2022-09-16 MED ORDER — ROSUVASTATIN CALCIUM 20 MG PO TABS: 20.0000 mg | ORAL_TABLET | Freq: Every day | ORAL | 3 refills | Status: DC

## 2022-09-27 ENCOUNTER — Telehealth: Payer: Self-pay | Admitting: Family Medicine

## 2022-09-27 NOTE — Telephone Encounter (Signed)
Patient came by office need refill on medication no longer get from old pcp  amphetamine-dextroamphetamine (ADDERALL XR) 25 MG 24 hr capsule [[id number]]   OR  Patient said has been getting  amphetamine-dextroamphetamine (ADDERALL XR) 20 MG salt  Pharmacy: [facility]

## 2022-10-18 DIAGNOSIS — F329 Major depressive disorder, single episode, unspecified: Secondary | ICD-10-CM | POA: Diagnosis not present

## 2022-10-18 DIAGNOSIS — F900 Attention-deficit hyperactivity disorder, predominantly inattentive type: Secondary | ICD-10-CM | POA: Diagnosis not present

## 2022-10-18 DIAGNOSIS — F411 Generalized anxiety disorder: Secondary | ICD-10-CM | POA: Diagnosis not present

## 2022-10-22 ENCOUNTER — Ambulatory Visit: Payer: Medicaid Other | Admitting: Family Medicine

## 2022-10-29 ENCOUNTER — Ambulatory Visit: Payer: Medicaid Other | Admitting: Family Medicine

## 2022-10-29 DIAGNOSIS — E669 Obesity, unspecified: Secondary | ICD-10-CM | POA: Diagnosis not present

## 2022-10-29 DIAGNOSIS — R030 Elevated blood-pressure reading, without diagnosis of hypertension: Secondary | ICD-10-CM | POA: Diagnosis not present

## 2022-10-29 DIAGNOSIS — Z6830 Body mass index (BMI) 30.0-30.9, adult: Secondary | ICD-10-CM | POA: Diagnosis not present

## 2022-10-29 DIAGNOSIS — L03032 Cellulitis of left toe: Secondary | ICD-10-CM | POA: Diagnosis not present

## 2022-11-02 ENCOUNTER — Encounter: Payer: Self-pay | Admitting: Family Medicine

## 2022-11-08 DIAGNOSIS — F329 Major depressive disorder, single episode, unspecified: Secondary | ICD-10-CM | POA: Diagnosis not present

## 2022-11-08 DIAGNOSIS — F411 Generalized anxiety disorder: Secondary | ICD-10-CM | POA: Diagnosis not present

## 2022-11-08 DIAGNOSIS — F900 Attention-deficit hyperactivity disorder, predominantly inattentive type: Secondary | ICD-10-CM | POA: Diagnosis not present

## 2022-11-11 ENCOUNTER — Institutional Professional Consult (permissible substitution): Payer: Self-pay | Admitting: Professional Counselor

## 2022-11-29 ENCOUNTER — Ambulatory Visit: Payer: Medicaid Other | Admitting: Family Medicine

## 2022-11-29 DIAGNOSIS — R600 Localized edema: Secondary | ICD-10-CM | POA: Insufficient documentation

## 2022-11-29 DIAGNOSIS — N939 Abnormal uterine and vaginal bleeding, unspecified: Secondary | ICD-10-CM | POA: Insufficient documentation

## 2022-11-29 DIAGNOSIS — J45909 Unspecified asthma, uncomplicated: Secondary | ICD-10-CM | POA: Diagnosis not present

## 2022-11-29 DIAGNOSIS — E039 Hypothyroidism, unspecified: Secondary | ICD-10-CM | POA: Diagnosis not present

## 2022-11-29 DIAGNOSIS — M7989 Other specified soft tissue disorders: Secondary | ICD-10-CM | POA: Diagnosis not present

## 2022-11-30 ENCOUNTER — Emergency Department (HOSPITAL_COMMUNITY): Payer: Medicaid Other

## 2022-11-30 ENCOUNTER — Other Ambulatory Visit: Payer: Self-pay

## 2022-11-30 ENCOUNTER — Encounter (HOSPITAL_COMMUNITY): Payer: Self-pay | Admitting: Emergency Medicine

## 2022-11-30 ENCOUNTER — Emergency Department (HOSPITAL_COMMUNITY)
Admission: EM | Admit: 2022-11-30 | Discharge: 2022-11-30 | Disposition: A | Discharge status: Home / Self Care | Payer: Medicaid Other | Attending: Emergency Medicine | Admitting: Emergency Medicine

## 2022-11-30 DIAGNOSIS — N939 Abnormal uterine and vaginal bleeding, unspecified: Secondary | ICD-10-CM

## 2022-11-30 DIAGNOSIS — M7989 Other specified soft tissue disorders: Secondary | ICD-10-CM | POA: Diagnosis not present

## 2022-11-30 DIAGNOSIS — R600 Localized edema: Secondary | ICD-10-CM

## 2022-11-30 LAB — CBC
HCT: 32.7 % — ABNORMAL LOW (ref 36.0–46.0)
Hemoglobin: 10.2 g/dL — ABNORMAL LOW (ref 12.0–15.0)
MCH: 25.7 pg — ABNORMAL LOW (ref 26.0–34.0)
MCHC: 31.2 g/dL (ref 30.0–36.0)
MCV: 82.4 fL (ref 80.0–100.0)
Platelets: 372 10*3/uL (ref 150–400)
RBC: 3.97 MIL/uL (ref 3.87–5.11)
RDW: 14.7 % (ref 11.5–15.5)
WBC: 10 10*3/uL (ref 4.0–10.5)
nRBC: 0 % (ref 0.0–0.2)

## 2022-11-30 LAB — HCG, SERUM, QUALITATIVE: Preg, Serum: NEGATIVE

## 2022-11-30 LAB — D-DIMER, QUANTITATIVE: D-Dimer, Quant: 0.71 ug{FEU}/mL — ABNORMAL HIGH (ref 0.00–0.50)

## 2022-11-30 MED ORDER — CEPHALEXIN 500 MG PO CAPS: 500.0000 mg | ORAL_CAPSULE | Freq: Three times a day (TID) | ORAL | 0 refills | Status: DC

## 2022-11-30 MED ORDER — CEPHALEXIN 500 MG PO CAPS
500.0000 mg | ORAL_CAPSULE | Freq: Once | ORAL | Status: AC
  Administered 2022-11-30: 500 mg via ORAL
  Filled 2022-11-30: qty 1

## 2022-11-30 MED ORDER — HYDROCODONE-ACETAMINOPHEN 5-325 MG PO TABS
1.0000 | ORAL_TABLET | Freq: Once | ORAL | Status: AC
  Administered 2022-11-30: 1 via ORAL
  Filled 2022-11-30: qty 1

## 2022-11-30 NOTE — Discharge Instructions (Signed)
Follow up for recheck next week

## 2022-11-30 NOTE — ED Notes (Signed)
Pt eating cookout while waiting in the lobby.

## 2022-11-30 NOTE — ED Provider Notes (Signed)
[place] EMERGENCY DEPARTMENT AT [facility] Provider Note   CSN: [id number] Arrival date & time: 11/29/22  2355     History  Chief Complaint  Patient presents with   Leg Swelling   Vaginal Bleeding    [name] is a 47 y.o. female.  The history is provided by the patient and a friend.   Patient with multiple complaints Patient history of cerebral palsy, asthma, fibromyalgia presents with left leg pain and swelling.  She reports over a month ago she was seen for the same thing and was diagnosed with cellulitis.  She has completed the antibiotics but the pain and redness and swelling has worsened.  No previous history of VTE  Patient also reports vaginal bleeding.  She reports her period just started and it is heavier than usual.  She is not on anticoagulation. 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 Authorizing Provider  cephALEXin (KEFLEX) 500 MG capsule Take 1 capsule (500 mg total) by mouth 3 (three) times daily. 11/30/22  Yes Zadie Rhine, MD  albuterol (PROVENTIL HFA;VENTOLIN HFA) 108 (90 BASE) MCG/ACT inhaler Inhale 2 puffs into the lungs every 4 (four) hours as needed for wheezing or shortness of breath.     [provider]  albuterol (PROVENTIL) (2.5 MG/3ML) 0.083% nebulizer solution Take 5 mg by nebulization every 4 (four) hours as needed for wheezing or shortness of breath.     [provider]  ALPRAZolam  Prudy Feeler) 1 MG tablet Take 1 mg by mouth 3 (three) times daily as needed for anxiety.    [provider]  amitriptyline (ELAVIL) 10 MG tablet Take 1 tablet (10 mg total) by mouth at bedtime. 09/10/22   Del Nigel Berthold, FNP  amphetamine-dextroamphetamine (ADDERALL XR) 25 MG 24 hr capsule Take 25 mg by mouth every morning.    [provider]  Fluticasone-Salmeterol (ADVAIR) 500-50 MCG/DOSE AEPB Inhale 1 puff into the lungs every 12 (twelve) hours.    [provider]  hydrochlorothiazide (HYDRODIURIL) 12.5 MG tablet Take 12.5 mg by mouth daily.    [provider]  levothyroxine (SYNTHROID) 300 MCG tablet Take 137 mcg by mouth daily.    [provider]  rosuvastatin (CRESTOR) 20 MG tablet Take 1 tablet (20 mg total) by mouth daily. 09/16/22   Del Nigel Berthold, FNP      Allergies    Doxycycline, Lisinopril, Mirapex [pramipexole dihydrochloride], and Tetracyclines & related    Review of Systems   Review of Systems  Cardiovascular:  Positive for leg swelling.  Genitourinary:  Positive for vaginal bleeding.    Physical Exam Updated Vital Signs BP 132/76   Pulse 68   Temp 98.7 F (37.1 C) (Oral)   Resp 16   Ht 1.727 m (5\' 8" )   Wt 90.7 kg   LMP 11/29/2022   SpO2 96%  BMI 30.41 kg/m  Physical Exam CONSTITUTIONAL:, Anxious, lying in the opposite direction of the bed HEAD: Normocephalic/atraumatic ABDOMEN: soft, nontender GU: Pelvic deferred NEURO: Pt is awake/alert/appropriate, moves all extremitiesx4.  No facial droop.   EXTREMITIES: pulses normal/equal, full ROM Left lower extremity with distal pulses intact.  Diffuse erythema and tenderness noted to the left calf and foot.  No crepitus.  No deformities Edema noted left lower extremity. No significant erythema noted to the right lower extremity SKIN: warm, color normal PSYCH: Anxious    ED Results / Procedures / Treatments   Labs (all labs ordered are listed, but only  abnormal results are displayed) Labs Reviewed  CBC - Abnormal; Notable for the following components:      Result Value   Hemoglobin 10.2 (*)    HCT 32.7 (*)    MCH 25.7 (*)    All other components within normal limits  D-DIMER, QUANTITATIVE - Abnormal; Notable for the following components:   D-Dimer, Quant 0.71 (*)    All other components within normal limits  HCG, SERUM, QUALITATIVE    EKG None  Radiology No results found.  Procedures Procedures    Medications Ordered in ED Medications  HYDROcodone-acetaminophen (NORCO/VICODIN) 5-325 MG per tablet 1 tablet (1 tablet Oral Given 11/30/22 0418)  cephALEXin (KEFLEX) capsule 500 mg (500 mg Oral Given 11/30/22 0444)    ED Course/ Medical Decision Making/ A&P Clinical Course as of 11/30/22 0701  Tue Nov 30, 2022  1027 Patient presents multiple complaints.  She reports heavy menstrual cycle, but labs revealed stable anemia.  She is not pregnant.  This can be followed up on as an outpatient  For left leg pain and swelling, suspect cellulitis but we need to rule out DVT.  Ultrasound imaging has been ordered [DW]  0700 Signed out to Dr. Estell Harpin at shift change to f/u on US imaging.  If negative she can discharged and treat for cellulitis - keflex sent to pharmacy  [DW]    Clinical Course User Index [DW] Zadie Rhine, MD                                 Medical Decision Making Amount and/or Complexity of Data Reviewed Labs: ordered.  Risk Prescription drug management.   This patient presents to the ED for concern of leg pain and swelling, this involves an extensive number of treatment options, and is a complaint that carries with it a high risk of complications and morbidity.  The differential diagnosis includes but is not limited to DVT, cellulitis, abscess, necrotizing fasciitis, septic joint  Comorbidities that complicate the patient evaluation: Patient's presentation is complicated by their history of cerebral  palsy  Additional history obtained: Additional history obtained from  friend   Lab Tests: I Ordered, and personally interpreted labs.  The pertinent results include: Stable anemia   Medicines ordered and prescription drug management: I ordered medication including Vicodin for pain Reevaluation of the patient after these medicines showed that the patient    improved   Complexity of problems addressed: Patient's presentation is most consistent with  acute presentation with potential threat to life or bodily function          Final Clinical Impression(s) / ED Diagnoses Final diagnoses:  Vaginal bleeding  Lower extremity edema    Rx / DC Orders ED Discharge Orders          Ordered    cephALEXin (KEFLEX)  500 MG capsule  3 times daily        11/30/22 0428              Zadie Rhine, MD 11/30/22 [id number]

## 2022-11-30 NOTE — ED Triage Notes (Addendum)
Pt with c/o L leg swelling and pain. States she was treated for cellulitis in LLE a month ago but states that pain and swelling "no better". Pt also reports heavy vaginal bleeding (started period yesterday) and states this is a chronic condition and was "supposed to start on meds to help but never did".

## 2022-11-30 NOTE — ED Notes (Addendum)
Pt sitting with family. Denied feeling discomfort in leg. Pt stated, "I know I need to see my OBGYN." Education was provided and concerns were addressed. Pt stated understanding. Discharge papers were given. Pt ambulated from ed room to lobby.

## 2022-11-30 NOTE — ED Notes (Signed)
Pt has returned to room.  Will make [place] aware.

## 2022-11-30 NOTE — ED Notes (Signed)
Pt noted to be pacing around her room w/o difficulty..  Sts she is having an argument with her landlord.  Pt updated that we are waiting on [place] for the DVT study.

## 2022-12-02 ENCOUNTER — Encounter: Payer: Self-pay | Admitting: *Deleted

## 2022-12-14 DIAGNOSIS — F900 Attention-deficit hyperactivity disorder, predominantly inattentive type: Secondary | ICD-10-CM | POA: Diagnosis not present

## 2022-12-14 DIAGNOSIS — F329 Major depressive disorder, single episode, unspecified: Secondary | ICD-10-CM | POA: Diagnosis not present

## 2022-12-14 DIAGNOSIS — F411 Generalized anxiety disorder: Secondary | ICD-10-CM | POA: Diagnosis not present

## 2022-12-15 ENCOUNTER — Telehealth: Payer: Self-pay | Admitting: Family Medicine

## 2022-12-15 ENCOUNTER — Other Ambulatory Visit: Payer: Self-pay | Admitting: Family Medicine

## 2022-12-15 NOTE — Telephone Encounter (Signed)
Patient LVM says she needs a form saying she needs an emotional support person. Please advise Thank you

## 2022-12-16 ENCOUNTER — Telehealth: Payer: Self-pay | Admitting: Family Medicine

## 2022-12-16 NOTE — Telephone Encounter (Signed)
Tenna Child Del Orbe Polanco [Your Practice Name] [Address] [place], [place], ZIP Code] [Date]  To Whom It May Concern,  I am writing on behalf of my patient, [Patient's Name], who is currently under my care. Due to the emotional and psychological stress associated with an upcoming court proceeding, I recommend that [Patient's Name] be allowed to have emotional support persons present during the hearing. The presence of these individuals is important to help alleviate anxiety and provide comfort during this challenging time.  Please feel free to contact my office if any further information or documentation is required.  Thank you for your attention to this matter.  Sincerely,  Seldon Barrell Del Newman Nip, MSN, APRN, FNP-C

## 2022-12-16 NOTE — Telephone Encounter (Signed)
Patient called in requesting note for emotional support persons for representation in court.  Cerebral palsy / stuttering  Wants a cll back

## 2022-12-20 NOTE — Telephone Encounter (Signed)
Lab letter completed by Florentina Addison, pt came by the office to pick up.

## 2022-12-24 ENCOUNTER — Institutional Professional Consult (permissible substitution): Payer: Self-pay | Admitting: Professional Counselor

## 2022-12-25 ENCOUNTER — Encounter (HOSPITAL_COMMUNITY): Payer: Self-pay | Admitting: *Deleted

## 2022-12-25 ENCOUNTER — Other Ambulatory Visit: Payer: Self-pay

## 2022-12-25 ENCOUNTER — Emergency Department (HOSPITAL_COMMUNITY): Payer: Medicaid Other

## 2022-12-25 ENCOUNTER — Emergency Department (HOSPITAL_COMMUNITY)
Admission: EM | Admit: 2022-12-25 | Discharge: 2022-12-25 | Disposition: A | Discharge status: Home / Self Care | Payer: Medicaid Other | Attending: Emergency Medicine | Admitting: Emergency Medicine

## 2022-12-25 DIAGNOSIS — E039 Hypothyroidism, unspecified: Secondary | ICD-10-CM | POA: Diagnosis not present

## 2022-12-25 DIAGNOSIS — W01198A Fall on same level from slipping, tripping and stumbling with subsequent striking against other object, initial encounter: Secondary | ICD-10-CM | POA: Diagnosis not present

## 2022-12-25 DIAGNOSIS — R519 Headache, unspecified: Secondary | ICD-10-CM | POA: Diagnosis not present

## 2022-12-25 DIAGNOSIS — S0990XA Unspecified injury of head, initial encounter: Secondary | ICD-10-CM | POA: Diagnosis not present

## 2022-12-25 DIAGNOSIS — W19XXXA Unspecified fall, initial encounter: Secondary | ICD-10-CM

## 2022-12-25 DIAGNOSIS — J320 Chronic maxillary sinusitis: Secondary | ICD-10-CM | POA: Diagnosis not present

## 2022-12-25 DIAGNOSIS — J45909 Unspecified asthma, uncomplicated: Secondary | ICD-10-CM | POA: Diagnosis not present

## 2022-12-25 DIAGNOSIS — M542 Cervicalgia: Secondary | ICD-10-CM | POA: Diagnosis not present

## 2022-12-25 DIAGNOSIS — J322 Chronic ethmoidal sinusitis: Secondary | ICD-10-CM | POA: Diagnosis not present

## 2022-12-25 MED ORDER — CEPHALEXIN 500 MG PO CAPS: 500.0000 mg | ORAL_CAPSULE | Freq: Four times a day (QID) | ORAL | 0 refills | Status: DC

## 2022-12-25 MED ORDER — NAPROXEN 500 MG PO TABS: 500.0000 mg | ORAL_TABLET | Freq: Two times a day (BID) | ORAL | 0 refills | Status: DC

## 2022-12-25 MED ORDER — ACETAMINOPHEN 325 MG PO TABS
650.0000 mg | ORAL_TABLET | Freq: Once | ORAL | Status: AC
  Administered 2022-12-25: 650 mg via ORAL
  Filled 2022-12-25: qty 2

## 2022-12-25 NOTE — ED Triage Notes (Signed)
Pt lost her balance while going up some steps last night.  Denies LOC.  Pt states she hit her head on wooden rail, c/o facial pain as well.

## 2022-12-25 NOTE — Discharge Instructions (Addendum)
Please take tylenol/ibuprofen/naproxen for pain. Take your antibiotics as prescribed for cellulitis. I recommend close follow-up with PCP for reevaluation.  Please do not hesitate to return to emergency department if worrisome signs symptoms we discussed become apparent.

## 2022-12-25 NOTE — ED Provider Notes (Signed)
EMERGENCY DEPARTMENT AT [facility] Provider Note   CSN: [id number] Arrival date & time: 12/25/22  1221     History  Chief Complaint  Patient presents with   [name] is a 47 y.o. female with a past medical history of asthma, fibromyalgia, hypothyroidism presents today for evaluation after fall.  Patient states she lost balance while going up some steps last night.  Patient states she hit her head on a wooden rail now has facial pain and headache.  Reports 1 episodes of vomiting last night.  Denies any fever, vision changes, dizziness, lightheadedness, chest pain, shortness of breath, bowel change, urinary symptoms, blood in the stool or urine.   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   CP (cerebral palsy) (HCC)    Left side affected   Cyst of brain    Depression    Zoloft    Fibromyalgia    Hypothyroidism    On Synthroid   Postpartum depression 09/29/2012   Past Surgical History:  Procedure Laterality Date   CARDIAC SURGERY     CESAREAN SECTION     CESAREAN SECTION WITH BILATERAL TUBAL LIGATION Bilateral 09/07/2012   Procedure: CESAREAN SECTION WITH BILATERAL TUBAL LIGATION;  Surgeon: Lazaro Arms, MD;  Location: [facility];  Service: Obstetrics;  Laterality: Bilateral;  Repeat    NASAL SINUS SURGERY       Home Medications Prior to Admission medications   Medication Sig Start Date End Date Taking? Authorizing Provider  ADVAIR DISKUS 500-50 MCG/ACT AEPB USE 1 INHALATION TWICE DAILY TO PREVENT PROBLEMS WITH BREATHING. 12/15/22   Del Newman Nip, Tenna Child, FNP  albuterol (PROVENTIL HFA;VENTOLIN HFA) 108 (90 BASE) MCG/ACT inhaler Inhale 2 puffs into the lungs every 4 (four) hours as needed for wheezing or shortness of breath.     [provider]  albuterol  (PROVENTIL) (2.5 MG/3ML) 0.083% nebulizer solution Take 5 mg by nebulization every 4 (four) hours as needed for wheezing or shortness of breath.     [provider]  ALPRAZolam Prudy Feeler) 1 MG tablet Take 1 mg by mouth 3 (three) times daily as needed for anxiety.    [provider]  amitriptyline (ELAVIL) 10 MG tablet Take 1 tablet (10 mg total) by mouth at bedtime. 09/10/22   Del Nigel Berthold, FNP  amphetamine-dextroamphetamine (ADDERALL XR) 25 MG 24 hr capsule Take 25 mg by mouth every morning.    [provider]  cephALEXin (KEFLEX) 500 MG capsule Take 1 capsule (500 mg total) by mouth 3 (three) times daily. 11/30/22   Zadie Rhine, MD  hydrochlorothiazide (HYDRODIURIL) 12.5 MG tablet Take 12.5 mg by mouth daily.    [provider]  levothyroxine (SYNTHROID) 300 MCG tablet Take 137 mcg by mouth daily.    [provider]  rosuvastatin (CRESTOR) 20 MG tablet Take 1 tablet (20 mg total) by mouth daily. 09/16/22   Del Nigel Berthold, FNP      Allergies    Doxycycline, Lisinopril, Mirapex [pramipexole dihydrochloride], and Tetracyclines & related    Review of Systems   Review of Systems Negative except as per HPI.  Physical Exam Updated Vital Signs BP 126/78 (BP Location: Right Arm)   Pulse 79   Temp 98.1 F (36.7 C) (Oral)  Resp 16   Ht 5\' 8"  (1.727 m)   Wt 90.7 kg   LMP 12/20/2022 (Approximate)   SpO2 100%   BMI 30.40 kg/m  Physical Exam Vitals and nursing note reviewed.  Constitutional:      Appearance: Normal appearance.  HENT:     Head: Normocephalic and atraumatic.     Mouth/Throat:     Mouth: Mucous membranes are moist.  Eyes:     General: No scleral icterus. Cardiovascular:     Rate and Rhythm: Normal rate and regular rhythm.     Pulses: Normal pulses.     Heart sounds: Normal heart sounds.  Pulmonary:     Effort: Pulmonary effort is normal.     Breath sounds: Normal breath sounds.  Abdominal:     General:  Abdomen is flat.     Palpations: Abdomen is soft.     Tenderness: There is no abdominal tenderness.  Musculoskeletal:        General: No deformity.  Skin:    General: Skin is warm.     Findings: No rash.  Neurological:     General: No focal deficit present.     Mental Status: She is alert.     Comments: Cranial nerves II through XII intact. Intact sensation to light touch in all 4 extremities. 5/5 strength in all 4 extremities. Intact finger-to-nose and heel-to-shin of all 4 extremities. No visual field cuts. No neglect noted. No aphasia noted.   Psychiatric:        Mood and Affect: Mood normal.     ED Results / Procedures / Treatments   Labs (all labs ordered are listed, but only abnormal results are displayed) Labs Reviewed - No data to display  EKG None  Radiology No results found.  Procedures Procedures    Medications Ordered in ED Medications  acetaminophen (TYLENOL) tablet 650 mg (has no administration in time range)    ED Course/ Medical Decision Making/ A&P                                 Medical Decision Making Amount and/or Complexity of Data Reviewed Radiology: ordered.  Risk OTC drugs.   This patient presents to the ED for evaluation after a fall, this involves an extensive number of treatment options, and is a complaint that carries with a high risk of complications and morbidity.  The differential diagnosis includes fracture, dislocation, head bleed.  This is not an exhaustive list.  Imaging studies: I ordered imaging studies, personally reviewed, interpreted imaging and agree with the radiologist's interpretations. The results include: CT head, cervical spine, maxillofacial are unremarkable.   Problem list/ ED course/ Critical interventions/ Medical management: HPI: See above Vital signs within normal range and stable throughout visit. Laboratory/imaging studies significant for: See above. On physical examination, patient is afebrile and  appears in no acute distress.  There was no focal neurological deficit on my examination. CT head, cervical spine, maxillofacial are unremarkable.  No hematoma or laceration noted.  Patient is awake, alert, oriented.  Well-appearing.  She is stable for discharge.  Patient also complained about skin infections on her foot.  Will send in Rx of Keflex.  Advised patient to take Tylenol/ibuprofen for pain, follow-up with PCP for reevaluation management.  Strict ED return precaution discussed.  Patient is agreeable to the plan. I have reviewed the patient home medicines and have made adjustments as needed.  Cardiac monitoring/EKG: The  patient was maintained on a cardiac monitor.  I personally reviewed and interpreted the cardiac monitor which showed an underlying rhythm of: sinus rhythm.  Additional history obtained: External records from outside source obtained and reviewed including: Chart review including previous notes, labs, imaging.  Consultations obtained:  Disposition Continued outpatient therapy. Follow-up with PCP recommended for reevaluation of symptoms. Treatment plan discussed with patient.  Pt acknowledged understanding was agreeable to the plan. Worrisome signs and symptoms were discussed with patient, and patient acknowledged understanding to return to the ED if they noticed these signs and symptoms. Patient was stable upon discharge.   This chart was dictated using voice recognition software.  Despite best efforts to proofread,  errors can occur which can change the documentation meaning.          Final Clinical Impression(s) / ED Diagnoses Final diagnoses:  Fall, initial encounter  Injury of head, initial encounter  Facial pain    Rx / DC Orders ED Discharge Orders     None         Jeanelle Malling, [place] 12/25/22 1608    Royanne Foots, DO 12/30/22 [phone]

## 2023-01-06 DIAGNOSIS — F900 Attention-deficit hyperactivity disorder, predominantly inattentive type: Secondary | ICD-10-CM | POA: Diagnosis not present

## 2023-01-06 DIAGNOSIS — F329 Major depressive disorder, single episode, unspecified: Secondary | ICD-10-CM | POA: Diagnosis not present

## 2023-01-06 DIAGNOSIS — F411 Generalized anxiety disorder: Secondary | ICD-10-CM | POA: Diagnosis not present

## 2023-01-06 NOTE — Patient Instructions (Signed)

## 2023-01-06 NOTE — Progress Notes (Unsigned)
   Established Patient Office Visit   Subjective  Patient ID: [name], female    DOB: [birth year]  Age: 47 y.o. MRN: [id number]  No chief complaint on file.   She  has a past medical history of Asthma, Cardiac abnormality (1979), CP (cerebral palsy) (HCC), Cyst of brain, Depression, Fibromyalgia, Hypothyroidism, and Postpartum depression (09/29/2012).  HPI  ROS    Objective:     LMP 12/20/2022 (Approximate)  {Vitals History (Optional):23777}  Physical Exam   No results found for any visits on 01/07/23.  The 10-year ASCVD risk score (Arnett DK, et al., 2019) is: 1%    Assessment & Plan:  There are no diagnoses linked to this encounter.  No follow-ups on file.   Cruzita Lederer Newman Nip, FNP

## 2023-01-07 ENCOUNTER — Ambulatory Visit: Payer: Medicaid Other | Admitting: Family Medicine

## 2023-01-11 DIAGNOSIS — F329 Major depressive disorder, single episode, unspecified: Secondary | ICD-10-CM | POA: Diagnosis not present

## 2023-01-11 DIAGNOSIS — F411 Generalized anxiety disorder: Secondary | ICD-10-CM | POA: Diagnosis not present

## 2023-01-11 DIAGNOSIS — F900 Attention-deficit hyperactivity disorder, predominantly inattentive type: Secondary | ICD-10-CM | POA: Diagnosis not present

## 2023-01-13 ENCOUNTER — Emergency Department (HOSPITAL_COMMUNITY)
Admission: EM | Admit: 2023-01-13 | Discharge: 2023-01-14 | Disposition: A | Discharge status: Home / Self Care | Payer: Medicaid Other | Attending: Emergency Medicine | Admitting: Emergency Medicine

## 2023-01-13 ENCOUNTER — Other Ambulatory Visit: Payer: Self-pay

## 2023-01-13 ENCOUNTER — Encounter (HOSPITAL_COMMUNITY): Payer: Self-pay | Admitting: Emergency Medicine

## 2023-01-13 DIAGNOSIS — M79672 Pain in left foot: Secondary | ICD-10-CM | POA: Diagnosis not present

## 2023-01-13 MED ORDER — KETOROLAC TROMETHAMINE 30 MG/ML IJ SOLN
15.0000 mg | Freq: Once | INTRAMUSCULAR | Status: DC
  Filled 2023-01-13: qty 1

## 2023-01-13 MED ORDER — OXYCODONE-ACETAMINOPHEN 5-325 MG PO TABS
2.0000 | ORAL_TABLET | Freq: Once | ORAL | Status: AC
  Administered 2023-01-13: 2 via ORAL
  Filled 2023-01-13: qty 2

## 2023-01-13 MED ORDER — KETOROLAC TROMETHAMINE 30 MG/ML IJ SOLN
30.0000 mg | Freq: Once | INTRAMUSCULAR | Status: AC
  Administered 2023-01-13: 30 mg via INTRAMUSCULAR

## 2023-01-13 NOTE — ED Triage Notes (Addendum)
Pt presents ambulatory to triage via POV with complaints of bilateral foot swelling due to excessive walking on her child's field trip a few days ago. Pt states she has had this issue in the past before and was prescribed Keflex. Pt has a thick callus to the L foot  A&Ox4 at this time. Denies CP or SOB.

## 2023-01-14 NOTE — ED Provider Notes (Signed)
[place] EMERGENCY DEPARTMENT AT [facility] Provider Note   CSN: [id number] Arrival date & time: 01/13/23  2057     History  Chief Complaint  Patient presents with   Foot Pain    [name] is a 47 y.o. female.  47 year old female with self-described cerebral palsy who presents ER today for pain.  Patient states he has been seen for this multiple times in the past and started on antibiotics multiple times and has not gotten any better.  She states that she has to walk around a lot and has low bit of a deformity of her left lower leg which causes her to walk abnormal and build up calluses.  She has 1 of these on her first MTP joint plantar surface.  She states that she has had some pain around that callus and then also redness around it as well.  She tried topical treatments as well which has not seemed to help.  She states that she is wants the pain to be better.  She does not have a podiatrist.  She does have some decrease sensation there as well.  No fevers, nausea, vomiting, weakness or other systemic symptoms.  No specific injury.   Foot Pain       Home Medications Prior to Admission medications   Medication Sig Start Date End Date Taking? Authorizing Provider  ADVAIR DISKUS 500-50 MCG/ACT AEPB USE 1 INHALATION TWICE DAILY TO PREVENT PROBLEMS WITH BREATHING. 12/15/22   Del Newman Nip, Tenna Child, FNP  albuterol (PROVENTIL HFA;VENTOLIN HFA) 108 (90 BASE) MCG/ACT inhaler Inhale 2 puffs into the lungs every 4 (four) hours as needed for wheezing or shortness of breath.     [provider]  albuterol (PROVENTIL) (2.5 MG/3ML) 0.083% nebulizer solution Take 5 mg by nebulization every 4 (four) hours as needed for wheezing or shortness of breath.     [provider]  ALPRAZolam Prudy Feeler) 1 MG tablet Take 1 mg by mouth 3 (three) times daily as needed for anxiety.    [provider]  amitriptyline (ELAVIL) 10 MG tablet Take 1 tablet (10 mg total) by  mouth at bedtime. 09/10/22   Del Nigel Berthold, FNP  amphetamine-dextroamphetamine (ADDERALL XR) 25 MG 24 hr capsule Take 25 mg by mouth every morning.    [provider]  cephALEXin (KEFLEX) 500 MG capsule Take 1 capsule (500 mg total) by mouth 3 (three) times daily. 11/30/22   Zadie Rhine, MD  cephALEXin (KEFLEX) 500 MG capsule Take 1 capsule (500 mg total) by mouth 4 (four) times daily. 12/25/22   Jeanelle Malling, PA  hydrochlorothiazide (HYDRODIURIL) 12.5 MG tablet Take 12.5 mg by mouth daily.    [provider]  levothyroxine (SYNTHROID) 300 MCG tablet Take 137 mcg by mouth daily.    [provider]  naproxen (NAPROSYN) 500 MG tablet Take 1 tablet (500 mg total) by mouth 2 (two) times daily. 12/25/22   Jeanelle Malling, PA  rosuvastatin (CRESTOR) 20 MG tablet Take 1 tablet (20 mg total) by mouth daily. 09/16/22   Del Nigel Berthold, FNP      Allergies    Doxycycline, Lisinopril, Mirapex [pramipexole dihydrochloride], and Tetracyclines & related    Review of Systems   Review of Systems  Physical Exam Updated Vital Signs BP (!) 125/92   Pulse (!) 108   Temp 98.1 F (36.7 C) (Oral)   Resp 20   Ht 5\' 8"  (1.727 m)   Wt 91.2 kg   LMP  01/12/2023 (Approximate)   SpO2 100%   BMI 30.57 kg/m  Physical Exam Vitals and nursing note reviewed.  Constitutional:      Appearance: She is well-developed.  HENT:     Head: Normocephalic and atraumatic.  Cardiovascular:     Rate and Rhythm: Normal rate and regular rhythm.  Pulmonary:     Effort: No respiratory distress.     Breath sounds: No stridor.  Abdominal:     General: There is no distension.  Musculoskeletal:     Cervical back: Normal range of motion.  Skin:    Comments: She has calluses at the ends of all of her toes on her left foot but then 1 large callus at the first MTP joint plantar surface.  She has some erythema around that area but it looks like just fresh skin from where it has peeled off.  It does  not feel warm its slightly tender to the touch but is not indurated and there is no streaking.  Neurological:     Mental Status: She is alert.     ED Results / Procedures / Treatments   Labs (all labs ordered are listed, but only abnormal results are displayed) Labs Reviewed - No data to display  EKG None  Radiology No results found.  Procedures Procedures    Medications Ordered in ED Medications  oxyCODONE-acetaminophen (PERCOCET/ROXICET) 5-325 MG per tablet 2 tablet (2 tablets Oral Given 01/13/23 2341)  ketorolac (TORADOL) 30 MG/ML injection 30 mg (30 mg Intramuscular Given 01/13/23 2356)    ED Course/ Medical Decision Making/ A&P                                 Medical Decision Making Risk Prescription drug management.   No indication for imaging at this time.  Suspect that this is just denuded skin around the callus site.  Referred to podiatry.  She has tried antibiotics in the past and it does not look like it is infected I do not see any indication for antibiotics either.  She will use anti-inflammatories at home and I also gave her some crutches to see if that would help alleviate some of the pain by keeping some of the pressure off of it but she is not hopeful.  Stable for discharge with podiatry follow-up, PCP follow-up ER return if worsening.  Final Clinical Impression(s) / ED Diagnoses Final diagnoses:  Foot pain, left    Rx / DC Orders ED Discharge Orders          Ordered    Ambulatory referral to Podiatry        01/13/23 2332              Moo Gravley, Barbara Cower, MD 01/14/23 [phone]

## 2023-02-02 ENCOUNTER — Ambulatory Visit: Payer: Medicaid Other | Admitting: Podiatry

## 2023-02-02 ENCOUNTER — Encounter: Payer: Self-pay | Admitting: Podiatry

## 2023-02-02 ENCOUNTER — Ambulatory Visit (INDEPENDENT_AMBULATORY_CARE_PROVIDER_SITE_OTHER): Payer: Medicaid Other

## 2023-02-02 DIAGNOSIS — M21619 Bunion of unspecified foot: Secondary | ICD-10-CM

## 2023-02-02 DIAGNOSIS — M216X2 Other acquired deformities of left foot: Secondary | ICD-10-CM | POA: Diagnosis not present

## 2023-02-02 DIAGNOSIS — M21612 Bunion of left foot: Secondary | ICD-10-CM | POA: Diagnosis not present

## 2023-02-02 DIAGNOSIS — M216X1 Other acquired deformities of right foot: Secondary | ICD-10-CM | POA: Diagnosis not present

## 2023-02-02 DIAGNOSIS — M21611 Bunion of right foot: Secondary | ICD-10-CM

## 2023-02-02 MED ORDER — AMMONIUM LACTATE 12 % EX LOTN: 1.0000 | TOPICAL_LOTION | CUTANEOUS | 0 refills | Status: AC | PRN

## 2023-02-02 NOTE — Progress Notes (Signed)
Subjective:  Patient ID: [name], female    DOB: February 06, 1976,  MRN: [id number]  Chief Complaint  Patient presents with   Callouses    PATIENT STATES HER FEET ARE HORRIBLE, PATIENT HAS CALLOUSES ON BILATERAL FEET , PATIENT STATES IT IS PAINFUL WHEN THE CALLOUSES ARE ON HER FEET, IT HAS BEEN LIKE THIS FOR YEARS .     47 y.o. female presents with the above complaint.  Patient presents with bilateral flatfoot.  Patient states her feet are horrible is causing a lot of calluses.  She states that it is painful to walk is progressive gotten worse she has cerebral palsy.  She does not wear any kind of bracing she denies any other acute complaints pain scale is 7 out of 10 dull aching nature.   Review of Systems: Negative except as noted in the HPI. Denies N/V/F/Ch.  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 , Disp: , Rfl:    albuterol (PROVENTIL) (2.5 MG/3ML) 0.083% nebulizer solution, Take 5 mg by nebulization every 4 (four) hours as needed for wheezing or shortness of breath. , Disp: , Rfl:    ALPRAZolam (XANAX) 1 MG tablet, Take 1 mg by mouth 3 (three) times daily as needed for anxiety., Disp: , Rfl:    amitriptyline (ELAVIL) 10 MG tablet, Take 1 tablet (10 mg total) by mouth at bedtime., Disp: 30 tablet, Rfl: 1   ammonium  lactate (AMLACTIN DAILY) 12 % lotion, Apply 1 Application topically as needed., Disp: 400 g, Rfl: 0   amphetamine-dextroamphetamine (ADDERALL XR) 25 MG 24 hr capsule, Take 25 mg by mouth every morning., Disp: , Rfl:    cephALEXin (KEFLEX) 500 MG capsule, Take 1 capsule (500 mg total) by mouth 3 (three) times daily., Disp: 21 capsule, Rfl: 0   cephALEXin (KEFLEX) 500 MG capsule, Take 1 capsule (500 mg total) by mouth 4 (four) times daily., Disp: 28 capsule, Rfl: 0   hydrochlorothiazide (HYDRODIURIL) 12.5 MG tablet, Take 12.5 mg by mouth daily., Disp: , Rfl:    levothyroxine (SYNTHROID) 300 MCG tablet, Take 137 mcg by mouth daily., Disp: , Rfl:    naproxen (NAPROSYN) 500 MG tablet, Take 1 tablet (500 mg total) by mouth 2 (two) times daily., Disp: 30 tablet, Rfl: 0   rosuvastatin (CRESTOR) 20 MG tablet, Take 1 tablet (20 mg total) by mouth daily., Disp: 90 tablet, Rfl: 3  Social History   Tobacco Use  Smoking Status Former   Types: Cigarettes  Smokeless Tobacco Never    Allergies  Allergen Reactions   Doxycycline Other (See Comments)    Unknown reaction   Lisinopril Other (See Comments)    unkown   Mirapex [Pramipexole Dihydrochloride] Hives   Tetracyclines & Related     "  made me feel like i was going crazy"   Objective:  There were no vitals filed for this visit. There is no height or weight on file to calculate BMI. Constitutional Well developed. Well nourished.  Vascular Dorsalis pedis pulses palpable bilaterally. Posterior tibial pulses palpable bilaterally. Capillary refill normal to all digits.  No cyanosis or clubbing noted. Pedal hair growth normal.  Neurologic Normal speech. Oriented to person, place, and time. Epicritic sensation to light touch grossly present bilaterally.  Dermatologic Nails well groomed and normal in appearance. No open wounds. No skin lesions.  Orthopedic: Bilateral pes planovalgus with calcaneovalgus to many toe signs multiple spots of  hyperkeratotic lesion noted.  Unable to perform single and double heel raise.  No open wounds or lesion noted   Radiographs: None Assessment:   1. Bunion   2. Other acquired deformities of right foot   3. Other acquired deformities of left foot    Plan:  Patient was evaluated and treated and all questions answered.  Bilateral pes planovalgus/foot deformity -All questions and concerns were discussed with the patient extensive detail given the nature of the deformity in setting of hyperkeratotic lesion that is causing her a lot of pain she would benefit from orthotics management/AFO bracing.  I discussed with the patient the importance of AFO bracing she was given a prescription to go to [facility] for AFO bracing

## 2023-02-15 DIAGNOSIS — F329 Major depressive disorder, single episode, unspecified: Secondary | ICD-10-CM | POA: Diagnosis not present

## 2023-02-15 DIAGNOSIS — F411 Generalized anxiety disorder: Secondary | ICD-10-CM | POA: Diagnosis not present

## 2023-02-15 DIAGNOSIS — F900 Attention-deficit hyperactivity disorder, predominantly inattentive type: Secondary | ICD-10-CM | POA: Diagnosis not present

## 2023-03-03 DIAGNOSIS — F900 Attention-deficit hyperactivity disorder, predominantly inattentive type: Secondary | ICD-10-CM | POA: Diagnosis not present

## 2023-03-03 DIAGNOSIS — F411 Generalized anxiety disorder: Secondary | ICD-10-CM | POA: Diagnosis not present

## 2023-03-03 DIAGNOSIS — F329 Major depressive disorder, single episode, unspecified: Secondary | ICD-10-CM | POA: Diagnosis not present

## 2023-03-04 ENCOUNTER — Other Ambulatory Visit: Payer: Self-pay

## 2023-03-04 ENCOUNTER — Encounter (HOSPITAL_COMMUNITY): Payer: Self-pay

## 2023-03-04 ENCOUNTER — Emergency Department (HOSPITAL_COMMUNITY)
Admission: EM | Admit: 2023-03-04 | Discharge: 2023-03-05 | Disposition: A | Discharge status: Home / Self Care | Payer: Medicaid Other | Attending: Emergency Medicine | Admitting: Emergency Medicine

## 2023-03-04 DIAGNOSIS — J342 Deviated nasal septum: Secondary | ICD-10-CM | POA: Diagnosis not present

## 2023-03-04 DIAGNOSIS — E039 Hypothyroidism, unspecified: Secondary | ICD-10-CM | POA: Diagnosis not present

## 2023-03-04 DIAGNOSIS — Z79899 Other long term (current) drug therapy: Secondary | ICD-10-CM | POA: Diagnosis not present

## 2023-03-04 DIAGNOSIS — J329 Chronic sinusitis, unspecified: Secondary | ICD-10-CM | POA: Diagnosis not present

## 2023-03-04 DIAGNOSIS — L03211 Cellulitis of face: Secondary | ICD-10-CM | POA: Insufficient documentation

## 2023-03-04 DIAGNOSIS — R220 Localized swelling, mass and lump, head: Secondary | ICD-10-CM | POA: Diagnosis present

## 2023-03-04 LAB — BASIC METABOLIC PANEL
Anion gap: 9 (ref 5–15)
BUN: 11 mg/dL (ref 6–20)
CO2: 22 mmol/L (ref 22–32)
Calcium: 8.6 mg/dL — ABNORMAL LOW (ref 8.9–10.3)
Chloride: 105 mmol/L (ref 98–111)
Creatinine, Ser: 0.66 mg/dL (ref 0.44–1.00)
GFR, Estimated: >60 mL/min (ref >60)
Glucose, Bld: 80 mg/dL (ref 70–99)
Potassium: 3.8 mmol/L (ref 3.5–5.1)
Sodium: 136 mmol/L (ref 135–145)

## 2023-03-04 LAB — CBC
HCT: 40.8 % (ref 36.0–46.0)
Hemoglobin: 12.8 g/dL (ref 12.0–15.0)
MCH: 26 pg (ref 26.0–34.0)
MCHC: 31.4 g/dL (ref 30.0–36.0)
MCV: 82.9 fL (ref 80.0–100.0)
Platelets: 394 10*3/uL (ref 150–400)
RBC: 4.92 MIL/uL (ref 3.87–5.11)
RDW: 16.6 % — ABNORMAL HIGH (ref 11.5–15.5)
WBC: 16.3 10*3/uL — ABNORMAL HIGH (ref 4.0–10.5)
nRBC: 0 % (ref 0.0–0.2)

## 2023-03-04 MED ORDER — ACETAMINOPHEN 500 MG PO TABS
1000.0000 mg | ORAL_TABLET | Freq: Once | ORAL | Status: AC
  Administered 2023-03-04: 1000 mg via ORAL
  Filled 2023-03-04: qty 2

## 2023-03-04 NOTE — ED Triage Notes (Signed)
 Pt reports 2 day hx of redness and pain to right cheek, painful upon palpation associated with right sided headache. She went to urgent care and was sent here to rule out abscess. Denies dental pain.

## 2023-03-05 ENCOUNTER — Emergency Department (HOSPITAL_COMMUNITY): Payer: Medicaid Other

## 2023-03-05 DIAGNOSIS — L03211 Cellulitis of face: Secondary | ICD-10-CM | POA: Diagnosis not present

## 2023-03-05 DIAGNOSIS — J342 Deviated nasal septum: Secondary | ICD-10-CM | POA: Diagnosis not present

## 2023-03-05 DIAGNOSIS — J329 Chronic sinusitis, unspecified: Secondary | ICD-10-CM | POA: Diagnosis not present

## 2023-03-05 MED ORDER — IOHEXOL 350 MG/ML SOLN
75.0000 mL | Freq: Once | INTRAVENOUS | Status: AC | PRN
  Administered 2023-03-05: 75 mL via INTRAVENOUS

## 2023-03-05 MED ORDER — CLINDAMYCIN HCL 300 MG PO CAPS: 300.0000 mg | ORAL_CAPSULE | Freq: Three times a day (TID) | ORAL | 0 refills | Status: AC

## 2023-03-05 MED ORDER — CLINDAMYCIN HCL 150 MG PO CAPS
300.0000 mg | ORAL_CAPSULE | Freq: Once | ORAL | Status: AC
  Administered 2023-03-05: 300 mg via ORAL
  Filled 2023-03-05: qty 2

## 2023-03-05 NOTE — ED Provider Notes (Signed)
 [place] EM[facility]GENCY DEPARTMENT AT [facility] Provider Note   CSN: [id number] Arrival date & time: 03/04/23  1745     History  Chief Complaint  Patient presents with   Facial Swelling    [name] is a 48 y.o. female who presents with concern for 2 days of right-sided facial swelling and pain with associated redness to the right cheek.  Was seen at urgent care and told to come to the emergency department to rule out abscess with CT scan.  Denies pain in the eye, blurry double vision.  Pain radiates to the teeth though she does not have any sensation of pain in the mouth typically, difficulty swallowing, hoarseness of voice, fevers or chills.  I reviewed medical records.  History of cerebral palsy affecting the left side, hypothyroidism, fibromyalgia.  Not currently on any antibiotics.  HPI     Home Medications Prior to Admission medications   Medication Sig Start Date End Date Taking? Authorizing Provider  clindamycin  (CLEOCIN ) 300 MG capsule Take 1 capsule (300 mg total) by mouth 3 (three) times daily for 7 days. 03/05/23 03/12/23 Yes Deema Juncaj R, PA-C  ADVAIR  DISKUS 500-50 MCG/ACT AEPB USE 1 INHALATION TWICE DAILY TO PREVENT PROBLEMS WITH BREATHING. 12/15/22   Del Orbe Polanco, Iliana, FNP  albuterol  (PROVENTIL  HFA;VENTOLIN  HFA) 108 (90 BASE) MCG/ACT inhaler Inhale 2 puffs into the lungs every 4 (four) hours as needed for wheezing or shortness of breath.     [provider]  albuterol  (PROVENTIL ) (2.5 MG/3ML) 0.083% nebulizer solution Take 5 mg by nebulization every 4 (four) hours as needed for wheezing or shortness of breath.     [provider]  ALPRAZolam (XANAX) 1 MG tablet Take 1 mg by mouth 3 (three) times daily as needed for anxiety.    [provider]  amitriptyline  (ELAVIL ) 10 MG tablet Take 1 tablet (10 mg total) by mouth at bedtime. 09/10/22   Del Orbe Polanco, Iliana, FNP  ammonium lactate  (AMLACTIN DAILY) 12 % lotion Apply  1 Application topically as needed. 02/02/23   Tobie Franky SQUIBB, DPM  amphetamine-dextroamphetamine (ADDERALL XR) 25 MG 24 hr capsule Take 25 mg by mouth every morning.    [provider]  hydrochlorothiazide (HYDRODIURIL) 12.5 MG tablet Take 12.5 mg by mouth daily.    [provider]  levothyroxine  (SYNTHROID ) 300 MCG tablet Take 137 mcg by mouth daily.    [provider]  naproxen  (NAPROSYN ) 500 MG tablet Take 1 tablet (500 mg total) by mouth 2 (two) times daily. 12/25/22   Ladora Congress, PA  rosuvastatin  (CRESTOR ) 20 MG tablet Take 1 tablet (20 mg total) by mouth daily. 09/16/22   Del Wilhelmena Lloyd Sola, FNP      Allergies    Doxycycline, Lisinopril, Mirapex [pramipexole dihydrochloride], and Tetracyclines & related    Review of Systems   Review of Systems  HENT:  Positive for facial swelling.     Physical Exam Updated Vital Signs BP 131/85 (BP Location: Right Arm)   Pulse 95   Temp 98.3 F (36.8 C) (Oral)   Resp 18   Ht 5' 8.5 (1.74 m)   Wt 77.6 kg   LMP 03/04/2023 (Approximate)   SpO2 100%   BMI 25.62 kg/m  Physical Exam Vitals and nursing note reviewed.  Constitutional:      Appearance: She is not ill-appearing or toxic-appearing.  HENT:     Head: Normocephalic and atraumatic.      Comments: No sublingual or submental  tenderness to palpation, right-sided submandibular lymphadenopathy.    Mouth/Throat:     Mouth: Mucous membranes are moist.     Pharynx: No oropharyngeal exudate or posterior oropharyngeal erythema.  Eyes:     General: Lids are normal. Vision grossly intact.        Right eye: No discharge.        Left eye: No discharge.     Extraocular Movements: Extraocular movements intact.     Conjunctiva/sclera: Conjunctivae normal.     Pupils: Pupils are equal, round, and reactive to light.  Cardiovascular:     Rate and Rhythm: Normal rate and regular rhythm.     Pulses: Normal pulses.  Pulmonary:     Effort: Pulmonary effort is normal. No  respiratory distress.     Breath sounds: Normal breath sounds. No wheezing or rales.  Abdominal:     General: There is no distension.     Tenderness: There is no abdominal tenderness.  Musculoskeletal:        General: No deformity.     Cervical back: Neck supple.  Lymphadenopathy:     Cervical: Cervical adenopathy present.     Right cervical: Superficial cervical adenopathy present.  Skin:    General: Skin is warm and dry.     Findings: Erythema present.  Neurological:     Mental Status: She is alert. Mental status is at baseline.  Psychiatric:        Mood and Affect: Mood normal.     ED Results / Procedures / Treatments   Labs (all labs ordered are listed, but only abnormal results are displayed) Labs Reviewed  CBC - Abnormal; Notable for the following components:      Result Value   WBC 16.3 (*)    RDW 16.6 (*)    All other components within normal limits  BASIC METABOLIC PANEL - Abnormal; Notable for the following components:   Calcium  8.6 (*)    All other components within normal limits    EKG None  Radiology CT Maxillofacial W Contrast Result Date: 03/05/2023 CLINICAL DATA:  Right cheek cellulitis. EXAM: CT MAXILLOFACIAL WITH CONTRAST TECHNIQUE: Multidetector CT imaging of the maxillofacial structures was performed with intravenous contrast. Multiplanar CT image reconstructions were also generated. RADIATION DOSE REDUCTION: This exam was performed according to the departmental dose-optimization program which includes automated exposure control, adjustment of the mA and/or kV according to patient size and/or use of iterative reconstruction technique. CONTRAST:  75mL OMNIPAQUE  IOHEXOL  350 MG/ML SOLN COMPARISON:  12/25/2022 FINDINGS: Osseous: Negative for fracture or mandibular dislocation. Orbits: No postseptal inflammation.  No evidence of mass lesion. Sinuses: Generalized mucosal thickening in the paranasal sinuses with prior endoscopic sinus surgery. Surgical openings are  diffusely patent. Residual mucosal thickening is greatest in the right maxillary sinus. There has been generalized improvement since prior. Polypoid densities seen in the upper nasal cavity. The anterior nasal septum is deviated to the left. Soft tissues: Soft tissue stranding in the right cheek. No underlying odontogenic cause or collection. Limited intracranial: Negative IMPRESSION: 1. Cellulitis in the right cheek without abscess or underlying odontogenic source seen. 2. Chronic sinusitis with prior endoscopic sinus surgery. Sinusitis has improved from 12/25/2022. Electronically Signed   By: Dorn Roulette M.D.   On: 03/05/2023 05:56    Procedures Procedures    Medications Ordered in ED Medications  clindamycin  (CLEOCIN ) capsule 300 mg (has no administration in time range)  acetaminophen  (TYLENOL ) tablet 1,000 mg (1,000 mg Oral Given 03/04/23 2111)  iohexol  (OMNIPAQUE )  350 MG/ML injection 75 mL (75 mLs Intravenous Contrast Given 03/05/23 0539)    ED Course/ Medical Decision Making/ A&P                                 Medical Decision Making 48 year old female with right-sided facial swelling and redness, DDx includes not limited to periorbital cellulitis, orbital cellulitis, facial cellulitis, odontogenic infection.  Hypertensive on intake, vital signs otherwise reassuring.  Vital signs normalized time of my evaluation.  Cardiopulmonary unremarkable, facial exam as above.  No area of fluctuance to suggest abscess.      Amount and/or Complexity of Data Reviewed Labs: ordered.    Details: CBC with leukocytosis of 16, BMP unremarkable. Radiology: ordered.    Details: CT maxillofacial with contrast revealed cellulitis of the right cheek without abscess or odontogenic source.  Chronic sinusitis improved from October 2024.    Risk OTC drugs. Prescription drug management.    Clinical picture most consistent with uncomplicated facial cellulitis.  Will initiate on antibiotics and  recommend close follow-up with her PCP.  Clinical concern for emergent underlying condition that would warrant further ED workup or patient management is exceedingly low.  Enriqueta  voiced understanding of her medical evaluation and treatment plan. Each of their questions answered to their expressed satisfaction.  Return precautions were given.  Patient is well-appearing, stable, and was discharged in good condition.  This chart was dictated using voice recognition software, Dragon. Despite the best efforts of this provider to proofread and correct errors, errors may still occur which can change documentation meaning.         Final Clinical Impression(s) / ED Diagnoses Final diagnoses:  Facial cellulitis    Rx / DC Orders ED Discharge Orders          Ordered    clindamycin  (CLEOCIN ) 300 MG capsule  3 times daily        03/05/23 0619              Sylvan Sookdeo, Pleasant SAUNDERS, PA-C 03/05/23 9373    Midge Golas, MD 03/05/23 [phone]

## 2023-03-05 NOTE — Discharge Instructions (Signed)
 You were seen here today for your facial swelling.  You have a skin infection of your face.  Please take the prescribed antibiotic for the entire course and follow-up with your primary care doctor.  Return to the ER with any severe symptoms.

## 2023-03-09 DIAGNOSIS — F329 Major depressive disorder, single episode, unspecified: Secondary | ICD-10-CM | POA: Diagnosis not present

## 2023-03-09 DIAGNOSIS — F900 Attention-deficit hyperactivity disorder, predominantly inattentive type: Secondary | ICD-10-CM | POA: Diagnosis not present

## 2023-03-09 DIAGNOSIS — F411 Generalized anxiety disorder: Secondary | ICD-10-CM | POA: Diagnosis not present

## 2023-03-29 ENCOUNTER — Telehealth: Payer: Self-pay

## 2023-03-29 NOTE — Telephone Encounter (Signed)
Insurance, Demographics and CN from last visit sent to [facility]  Patient has physical RX and will try to set appt with them  Addison Bailey CPed, CFo, CFm

## 2023-04-11 DIAGNOSIS — F329 Major depressive disorder, single episode, unspecified: Secondary | ICD-10-CM | POA: Diagnosis not present

## 2023-04-11 DIAGNOSIS — F411 Generalized anxiety disorder: Secondary | ICD-10-CM | POA: Diagnosis not present

## 2023-04-11 DIAGNOSIS — F900 Attention-deficit hyperactivity disorder, predominantly inattentive type: Secondary | ICD-10-CM | POA: Diagnosis not present

## 2023-04-19 DIAGNOSIS — H5213 Myopia, bilateral: Secondary | ICD-10-CM | POA: Diagnosis not present

## 2023-04-21 DIAGNOSIS — F329 Major depressive disorder, single episode, unspecified: Secondary | ICD-10-CM | POA: Diagnosis not present

## 2023-04-21 DIAGNOSIS — F900 Attention-deficit hyperactivity disorder, predominantly inattentive type: Secondary | ICD-10-CM | POA: Diagnosis not present

## 2023-04-21 DIAGNOSIS — F411 Generalized anxiety disorder: Secondary | ICD-10-CM | POA: Diagnosis not present

## 2023-05-04 DIAGNOSIS — F329 Major depressive disorder, single episode, unspecified: Secondary | ICD-10-CM | POA: Diagnosis not present

## 2023-05-04 DIAGNOSIS — F900 Attention-deficit hyperactivity disorder, predominantly inattentive type: Secondary | ICD-10-CM | POA: Diagnosis not present

## 2023-05-04 DIAGNOSIS — F411 Generalized anxiety disorder: Secondary | ICD-10-CM | POA: Diagnosis not present

## 2023-05-30 DIAGNOSIS — F411 Generalized anxiety disorder: Secondary | ICD-10-CM | POA: Diagnosis not present

## 2023-05-30 DIAGNOSIS — F329 Major depressive disorder, single episode, unspecified: Secondary | ICD-10-CM | POA: Diagnosis not present

## 2023-05-30 DIAGNOSIS — F900 Attention-deficit hyperactivity disorder, predominantly inattentive type: Secondary | ICD-10-CM | POA: Diagnosis not present

## 2023-06-02 DIAGNOSIS — F329 Major depressive disorder, single episode, unspecified: Secondary | ICD-10-CM | POA: Diagnosis not present

## 2023-06-02 DIAGNOSIS — F411 Generalized anxiety disorder: Secondary | ICD-10-CM | POA: Diagnosis not present

## 2023-06-02 DIAGNOSIS — F900 Attention-deficit hyperactivity disorder, predominantly inattentive type: Secondary | ICD-10-CM | POA: Diagnosis not present

## 2023-06-06 DIAGNOSIS — H5203 Hypermetropia, bilateral: Secondary | ICD-10-CM | POA: Diagnosis not present

## 2023-06-09 DIAGNOSIS — M216X1 Other acquired deformities of right foot: Secondary | ICD-10-CM | POA: Diagnosis not present

## 2023-06-09 DIAGNOSIS — M216X2 Other acquired deformities of left foot: Secondary | ICD-10-CM | POA: Diagnosis not present

## 2023-06-16 DIAGNOSIS — F411 Generalized anxiety disorder: Secondary | ICD-10-CM | POA: Diagnosis not present

## 2023-06-16 DIAGNOSIS — F900 Attention-deficit hyperactivity disorder, predominantly inattentive type: Secondary | ICD-10-CM | POA: Diagnosis not present

## 2023-06-16 DIAGNOSIS — F329 Major depressive disorder, single episode, unspecified: Secondary | ICD-10-CM | POA: Diagnosis not present

## 2023-07-14 DIAGNOSIS — F900 Attention-deficit hyperactivity disorder, predominantly inattentive type: Secondary | ICD-10-CM | POA: Diagnosis not present

## 2023-07-14 DIAGNOSIS — F411 Generalized anxiety disorder: Secondary | ICD-10-CM | POA: Diagnosis not present

## 2023-07-14 DIAGNOSIS — F329 Major depressive disorder, single episode, unspecified: Secondary | ICD-10-CM | POA: Diagnosis not present

## 2023-07-19 DIAGNOSIS — F329 Major depressive disorder, single episode, unspecified: Secondary | ICD-10-CM | POA: Diagnosis not present

## 2023-07-19 DIAGNOSIS — F411 Generalized anxiety disorder: Secondary | ICD-10-CM | POA: Diagnosis not present

## 2023-07-19 DIAGNOSIS — F900 Attention-deficit hyperactivity disorder, predominantly inattentive type: Secondary | ICD-10-CM | POA: Diagnosis not present

## 2023-09-01 DIAGNOSIS — F329 Major depressive disorder, single episode, unspecified: Secondary | ICD-10-CM | POA: Diagnosis not present

## 2023-09-01 DIAGNOSIS — F411 Generalized anxiety disorder: Secondary | ICD-10-CM | POA: Diagnosis not present

## 2023-09-01 DIAGNOSIS — F900 Attention-deficit hyperactivity disorder, predominantly inattentive type: Secondary | ICD-10-CM | POA: Diagnosis not present

## 2023-10-18 ENCOUNTER — Ambulatory Visit: Payer: Self-pay

## 2023-10-18 DIAGNOSIS — F329 Major depressive disorder, single episode, unspecified: Secondary | ICD-10-CM | POA: Diagnosis not present

## 2023-10-18 DIAGNOSIS — F411 Generalized anxiety disorder: Secondary | ICD-10-CM | POA: Diagnosis not present

## 2023-10-18 DIAGNOSIS — F900 Attention-deficit hyperactivity disorder, predominantly inattentive type: Secondary | ICD-10-CM | POA: Diagnosis not present

## 2023-10-18 NOTE — Telephone Encounter (Signed)
 FYI Only or Action Required?: Action required by provider: medication refill request.  Needing a refill on her rescue inhaler  Patient was last seen in primary care on 09/10/2022 by Del Wilhelmena Lloyd Sola, FNP.  Called Nurse Triage reporting Asthma.  Symptoms began several months ago.  Interventions attempted: Prescription medications: advair  and ventolin .  Symptoms are: gradually worsening.  Triage Disposition: See PCP Within 2 Weeks  Patient/caregiver understands and will follow disposition?: Yes    Copied from CRM [id number]. Topic: Clinical - Red Word Triage >> Oct 18, 2023  2:24 PM Marissa P wrote: Red Word that prompted transfer to Nurse Triage: Patient has been having asthma attacks every night for the last 2 months, out of her asthma meds. Reason for Disposition  Asthma attacks frequently awaken from sleep (e.g., > 2 times / month)  Answer Assessment - Initial Assessment Questions 1. RESPIRATORY STATUS: Describe your breathing? (e.g., wheezing, shortness of breath, unable to speak, severe coughing)      Sometimes sob and some times wheezing 2. ONSET: When did this asthma attack begin?      2 months ago  3. TRIGGER: What do you think triggered this attack? (e.g., URI, exposure to pollen or other allergen, tobacco smoke)      Unknown possibly allergies 4. PEAK EXPIRATORY FLOW RATE (PEFR): Do you use a peak flow meter? If Yes, ask: What's the current peak flow? What's your personal best peak flow?      Doesn't know where it is 5. SEVERITY: How bad is this attack?      Enough to make her scared if she didn't have a puff on her inhaler, mostly happen at night, last time was last night 6. ASTHMA MEDICINES:  What treatments have you tried?      Advair  and ventolin  7. INHALED QUICK-RELIEF TREATMENTS FOR THIS ATTACK: What treatments have you given yourself so far? and How many and how often? If using an inhaler, ask, How many puffs? Note: Routine treatments are  2 puffs every 4 hours as needed. Rescue treatments are 4 puffs repeated every 20 minutes, up to three times as needed.      Usually only do 2 puffs  8. OTHER SYMPTOMS: Do you have any other symptoms? (e.g., chest pain, coughing up yellow sputum, fever, runny nose)     no 9. O2 SATURATION MONITOR:  Do you use an oxygen saturation monitor (pulse oximeter) at home? If Yes, What is your reading (oxygen level) today? What is your usual oxygen saturation reading? (e.g., 95%)     Not currenly having an attack and dont have a pulse ox  Protocols used: Asthma Attack-A-AH

## 2023-10-19 ENCOUNTER — Other Ambulatory Visit: Payer: Self-pay | Admitting: Family Medicine

## 2023-10-19 ENCOUNTER — Encounter: Payer: Self-pay | Admitting: Pharmacy Technician

## 2023-10-19 ENCOUNTER — Other Ambulatory Visit (HOSPITAL_COMMUNITY): Payer: Self-pay

## 2023-10-19 ENCOUNTER — Telehealth: Payer: Self-pay | Admitting: Pharmacy Technician

## 2023-10-19 MED ORDER — ALBUTEROL SULFATE HFA 108 (90 BASE) MCG/ACT IN AERS: 2.0000 | INHALATION_SPRAY | RESPIRATORY_TRACT | 6 refills | Status: DC | PRN

## 2023-10-19 MED ORDER — ADVAIR DISKUS 500-50 MCG/ACT IN AEPB: 1.0000 | INHALATION_SPRAY | Freq: Two times a day (BID) | RESPIRATORY_TRACT | 11 refills | Status: DC

## 2023-10-19 NOTE — Telephone Encounter (Signed)
 sent

## 2023-10-19 NOTE — Telephone Encounter (Signed)
 error

## 2023-10-19 NOTE — Telephone Encounter (Signed)
 Pharmacy Patient Advocate Encounter   Received notification from Patient Pharmacy that prior authorization for Albuterol  Sulfate HFA 108 (90 Base)MCG/ACT aerosol is required/requested.   Insurance verification completed.   The patient is insured through [facility] .   Per test claim:  Brand name Ventolin  HFA is preferred by the insurance.  If suggested medication is appropriate, Please send in a new RX and discontinue this one. If not, please advise as to why it's not appropriate so that we may request a Prior Authorization. Please note, some preferred medications may still require a PA.  If the suggested medications have not been trialed and there are no contraindications to their use, the PA will not be submitted, as it will not be approved.  New prescription is not required. Medications are interchangeable at the pharmacy as brand preferred. [employer] and spoke to [place] Sherrod the pharmacist and he advised that he would correct.

## 2023-10-19 NOTE — Telephone Encounter (Signed)
 Patient advised.

## 2023-10-24 ENCOUNTER — Encounter: Payer: Self-pay | Admitting: Family Medicine

## 2023-10-24 ENCOUNTER — Ambulatory Visit: Payer: Self-pay | Admitting: Family Medicine

## 2023-10-24 VITALS — BP 134/84 | HR 88 | Resp 16 | Ht 68.0 in | Wt 187.8 lb

## 2023-10-24 DIAGNOSIS — E039 Hypothyroidism, unspecified: Secondary | ICD-10-CM | POA: Diagnosis not present

## 2023-10-24 DIAGNOSIS — J4521 Mild intermittent asthma with (acute) exacerbation: Secondary | ICD-10-CM

## 2023-10-24 MED ORDER — ADVAIR DISKUS 500-50 MCG/ACT IN AEPB: 1.0000 | INHALATION_SPRAY | Freq: Two times a day (BID) | RESPIRATORY_TRACT | 11 refills | Status: AC

## 2023-10-24 MED ORDER — LEVOTHYROXINE SODIUM 300 MCG PO TABS: 137.0000 ug | ORAL_TABLET | Freq: Every day | ORAL | 3 refills | Status: AC

## 2023-10-24 MED ORDER — ALBUTEROL SULFATE HFA 108 (90 BASE) MCG/ACT IN AERS: 2.0000 | INHALATION_SPRAY | RESPIRATORY_TRACT | 6 refills | Status: AC | PRN

## 2023-10-24 NOTE — Progress Notes (Signed)
 Acute Office Visit  Subjective:    Patient ID: [name], female    DOB: 1975-11-08, 48 y.o.   MRN: [id number]  Chief Complaint  Patient presents with   Asthma    Wakes up everynight with an astma attack. If she doesn't go to sleep, she doesn't have an asthma attack. Has only been using the proventil . Has been out of the advair .     HPI Patient is in today for  with the above complaints.   The patient reports waking up with wheezing and shortness of breath for approximately two months. She has been using her rescue inhaler more frequently. Symptoms currently occur only at night; she notes she has not been exercising as regularly as she should.  She also reports being without her thyroid  medication since May and is requesting a refill today.  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   CP (cerebral palsy) (HCC)    Left side affected   Cyst of brain    Depression    Zoloft     Fibromyalgia    Hypothyroidism    On Synthroid    Postpartum depression 09/29/2012    Past Surgical History:  Procedure Laterality Date   CARDIAC SURGERY     CESAREAN SECTION     CESAREAN SECTION WITH BILATERAL TUBAL LIGATION Bilateral 09/07/2012   Procedure: CESAREAN SECTION WITH BILATERAL TUBAL LIGATION;  Surgeon: Vonn VEAR Inch, MD;  Location: [facility];  Service: Obstetrics;  Laterality: Bilateral;  Repeat    NASAL SINUS SURGERY      Family History  Problem Relation Age of Onset   Hypertension Mother    COPD Mother    Diabetes Father    Diabetes Paternal Grandmother     Social History   Socioeconomic History   Marital status: Legally Separated    Spouse name: Not on file   Number of children: Not on file   Years of education: Not on file   Highest education level: Not on file  Occupational History   Not on file  Tobacco Use    Smoking status: Former    Types: Cigarettes   Smokeless tobacco: Never  Vaping Use   Vaping status: Some Days  Substance and Sexual Activity   Alcohol use: No   Drug use: No   Sexual activity: Not Currently    Birth control/protection: None  Other Topics Concern   Not on file  Social History Narrative   Not on file   Social Drivers of Health   Financial Resource Strain: Not on file  Food Insecurity: Not on file  Transportation Needs: Not on file  Physical Activity: Not on file  Stress: Not on file  Social Connections: Not on file  Intimate Partner Violence: Not on file    Outpatient Medications Prior to Visit  Medication Sig Dispense Refill   albuterol  (PROVENTIL ) (2.5 MG/3ML) 0.083% nebulizer solution Take 5 mg by nebulization every 4 (four) hours as needed for wheezing or shortness of breath.      ALPRAZolam (XANAX) 1 MG tablet Take 1 mg by mouth 3 (three) times daily as needed for anxiety.     amitriptyline  (ELAVIL ) 10 MG tablet Take 1 tablet (10 mg total) by mouth at bedtime. 30 tablet 1   ammonium lactate  (AMLACTIN DAILY) 12 % lotion Apply 1 Application topically  as needed. 400 g 0   amphetamine-dextroamphetamine (ADDERALL XR) 25 MG 24 hr capsule Take 25 mg by mouth every morning.     hydrochlorothiazide (HYDRODIURIL) 12.5 MG tablet Take 12.5 mg by mouth daily.     naproxen  (NAPROSYN ) 500 MG tablet Take 1 tablet (500 mg total) by mouth 2 (two) times daily. 30 tablet 0   rosuvastatin  (CRESTOR ) 20 MG tablet Take 1 tablet (20 mg total) by mouth daily. 90 tablet 3   ADVAIR  DISKUS 500-50 MCG/ACT AEPB Inhale 1 puff into the lungs in the morning and at bedtime. 60 each 11   albuterol  (VENTOLIN  HFA) 108 (90 Base) MCG/ACT inhaler Inhale 2 puffs into the lungs every 4 (four) hours as needed for wheezing or shortness of breath. 8 g 6   levothyroxine  (SYNTHROID ) 300 MCG tablet Take 137 mcg by mouth daily.     No facility-administered medications prior to visit.    Allergies   Allergen Reactions   Doxycycline Other (See Comments)    Unknown reaction   Lisinopril Other (See Comments)    unkown   Mirapex [Pramipexole Dihydrochloride] Hives   Tetracyclines & Related     made me feel like i was going crazy    Review of Systems  Constitutional:  Negative for chills and fever.  HENT:  Negative for congestion.   Eyes:  Negative for visual disturbance.  Respiratory:  Positive for cough, shortness of breath and wheezing. Negative for chest tightness.   Neurological:  Negative for dizziness and headaches.       Objective:    Physical Exam HENT:     Head: Normocephalic.     Mouth/Throat:     Mouth: Mucous membranes are moist.  Cardiovascular:     Rate and Rhythm: Normal rate.     Heart sounds: Normal heart sounds.  Pulmonary:     Effort: Pulmonary effort is normal.     Breath sounds: Normal breath sounds. No wheezing.  Neurological:     Mental Status: She is alert.     BP 134/84   Pulse 88   Resp 16   Ht 5' 8 (1.727 m)   Wt 187 lb 12.8 oz (85.2 kg)   SpO2 98%   BMI 28.55 kg/m  Wt Readings from Last 3 Encounters:  10/24/23 187 lb 12.8 oz (85.2 kg)  03/04/23 171 lb (77.6 kg)  01/13/23 201 lb 1 oz (91.2 kg)       Assessment & Plan:  Mild intermittent asthma with acute exacerbation Assessment & Plan: Refills were sent to the pharmacy.  The patient is encouraged to follow up if experiencing symptoms of asthma exacerbation, including increased shortness of breath, wheezing, chest tightness, nocturnal awakenings, or increased use of the rescue inhaler. A prescription for prednisone  will be initiated if symptoms worsen.    Orders: -     Albuterol  Sulfate HFA; Inhale 2 puffs into the lungs every 4 (four) hours as needed for wheezing or shortness of breath.  Dispense: 8 g; Refill: 6 -     Advair  Diskus; Inhale 1 puff into the lungs in the morning and at bedtime.  Dispense: 60 each; Refill: 11  Acquired hypothyroidism Assessment &  Plan: Pending labs Refills sent The patient is encouraged to follow up if experiencing symptoms of hypothyroidism, despite compliance with the current treatment regimen, such as fatigue, weight gain, constipation, dry skin, cold intolerance, hair loss, or depression.   Orders: -     Levothyroxine  Sodium; Take 0.5 tablets (150 mcg  total) by mouth daily.  Dispense: 30 tablet; Refill: 3 -     TSH + free T4  Note: This chart has been completed using Engineer, civil (consulting) software, and while attempts have been made to ensure accuracy, certain words and phrases may not be transcribed as intended.    Mallery Harshman, FNP

## 2023-10-24 NOTE — Assessment & Plan Note (Addendum)
 Pending labs Refills sent The patient is encouraged to follow up if experiencing symptoms of hypothyroidism, despite compliance with the current treatment regimen, such as fatigue, weight gain, constipation, dry skin, cold intolerance, hair loss, or depression.

## 2023-10-24 NOTE — Patient Instructions (Addendum)
 I appreciate the opportunity to provide care to you today!  Labs: please stop by the lab today to get your blood drawn (TSH)  Refills have been sent to your pharmacy   Please follow up if your symptoms worsen or fail to improve.   Please continue to a heart-healthy diet and increase your physical activities. Try to exercise for at least five days a week.    It was a pleasure to see you and I look forward to continuing to work together on your health and well-being. Please do not hesitate to call the office if you need care or have questions about your care.  In case of emergency, please visit the Emergency Department for urgent care, or contact our clinic at [phone] to schedule an appointment. We're here to help you!   Have a wonderful day and week. With Gratitude, Nikyla Navedo MSN, FNP-BC

## 2023-10-24 NOTE — Assessment & Plan Note (Addendum)
 Refills were sent to the pharmacy.  The patient is encouraged to follow up if experiencing symptoms of asthma exacerbation, including increased shortness of breath, wheezing, chest tightness, nocturnal awakenings, or increased use of the rescue inhaler. A prescription for prednisone  will be initiated if symptoms worsen.

## 2023-10-25 LAB — TSH+FREE T4
Free T4: 0.55 ng/dL — ABNORMAL LOW (ref 0.82–1.77)
TSH: 67.3 u[IU]/mL — ABNORMAL HIGH (ref 0.450–4.500)

## 2023-10-29 ENCOUNTER — Ambulatory Visit: Payer: Self-pay | Admitting: Family Medicine

## 2023-10-29 DIAGNOSIS — E039 Hypothyroidism, unspecified: Secondary | ICD-10-CM

## 2023-11-01 DIAGNOSIS — J209 Acute bronchitis, unspecified: Secondary | ICD-10-CM | POA: Diagnosis not present

## 2023-11-01 DIAGNOSIS — R059 Cough, unspecified: Secondary | ICD-10-CM | POA: Diagnosis not present

## 2023-11-17 ENCOUNTER — Telehealth (INDEPENDENT_AMBULATORY_CARE_PROVIDER_SITE_OTHER): Payer: Self-pay | Admitting: Family Medicine

## 2023-11-17 DIAGNOSIS — E039 Hypothyroidism, unspecified: Secondary | ICD-10-CM

## 2023-11-17 NOTE — Progress Notes (Signed)
 Virtual Visit via Video Note  I connected with [name] on 11/17/23 at  1:00 PM EDT by a video enabled telemedicine application and verified that I am speaking with the correct person using two identifiers.  Patient Location: Home Provider Location: Home Office  I discussed the limitations, risks, security, and privacy concerns of performing an evaluation and management service by video and the availability of in person appointments. I also discussed with the patient that there may be a patient responsible charge related to this service. The patient expressed understanding and agreed to proceed.  Subjective: PCP: Terry Wilhelmena Lloyd Hilario, FNP  No chief complaint on file.  HPI Patient presents via telehealth for thyroid  labs follow up. For the details of today's visit, please refer to assessment and plan.     ROS: Per HPI  Current Outpatient Medications:    ADVAIR  DISKUS 500-50 MCG/ACT AEPB, Inhale 1 puff into the lungs in the morning and at bedtime., Disp: 60 each, Rfl: 11   albuterol  (PROVENTIL ) (2.5 MG/3ML) 0.083% nebulizer solution, Take 5 mg by nebulization every 4 (four) hours as needed for wheezing or shortness of breath. , Disp: , Rfl:    albuterol  (VENTOLIN  HFA) 108 (90 Base) MCG/ACT inhaler, Inhale 2 puffs into the lungs every 4 (four) hours as needed for wheezing or shortness of breath., Disp: 8 g, Rfl: 6   ALPRAZolam (XANAX) 1 MG tablet, Take 1 mg by mouth 3 (three) times daily as needed for anxiety., Disp: , Rfl:    amitriptyline  (ELAVIL ) 10 MG tablet, Take 1 tablet (10 mg total) by mouth at bedtime., Disp: 30 tablet, Rfl: 1   ammonium lactate  (AMLACTIN DAILY) 12 % lotion, Apply 1 Application topically as needed., Disp: 400 g, Rfl: 0   amphetamine-dextroamphetamine (ADDERALL XR) 25 MG 24 hr capsule, Take 25 mg by mouth every morning., Disp: , Rfl:    hydrochlorothiazide (HYDRODIURIL) 12.5 MG tablet, Take 12.5 mg by mouth daily., Disp: , Rfl:    levothyroxine   (SYNTHROID ) 300 MCG tablet, Take 0.5 tablets (150 mcg total) by mouth daily., Disp: 30 tablet, Rfl: 3   naproxen  (NAPROSYN ) 500 MG tablet, Take 1 tablet (500 mg total) by mouth 2 (two) times daily., Disp: 30 tablet, Rfl: 0   rosuvastatin  (CRESTOR ) 20 MG tablet, Take 1 tablet (20 mg total) by mouth daily., Disp: 90 tablet, Rfl: 3  Observations/Objective: There were no vitals filed for this visit. Physical Exam Patient is alert and no acute distress noted.   Assessment and Plan: Hypothyroidism, unspecified type Assessment & Plan: Previous labs TSH levels elevated 67.300 Patient reports taking synthroid  150 mcg once daily before breakfast with full glass of water Advise repeat labs needed  6 weeks from previous labs to monitor trends and dose adjustments on or around October 6  Orders: -     Thyroid  peroxidase antibody -     TSH + free T4 -     Thyroid  stimulating immunoglobulin    Follow Up Instructions: No follow-ups on file.   I discussed the assessment and treatment plan with the patient. The patient was provided an opportunity to ask questions, and all were answered. The patient agreed with the plan and demonstrated an understanding of the instructions.   The patient was advised to call back or seek an in-person evaluation if the symptoms worsen or if the condition fails to improve as anticipated.  The above assessment and management plan was discussed with the patient. The patient verbalized understanding of and  has agreed to the management plan.   Melanie Kidd Wilhelmena Falter, FNP

## 2023-11-17 NOTE — Assessment & Plan Note (Signed)
 Previous labs TSH levels elevated 67.300 Patient reports taking synthroid  150 mcg once daily before breakfast with full glass of water Advise repeat labs needed  6 weeks from previous labs to monitor trends and dose adjustments on or around October 6

## 2023-11-18 ENCOUNTER — Ambulatory Visit: Payer: Self-pay | Admitting: Family Medicine

## 2023-11-21 ENCOUNTER — Ambulatory Visit: Admitting: Adult Health

## 2023-11-21 ENCOUNTER — Other Ambulatory Visit (HOSPITAL_COMMUNITY)
Admission: RE | Admit: 2023-11-21 | Discharge: 2023-11-21 | Disposition: A | Discharge status: Home / Self Care | Source: Ambulatory Visit | Attending: Adult Health | Admitting: Adult Health

## 2023-11-21 ENCOUNTER — Encounter: Payer: Self-pay | Admitting: Adult Health

## 2023-11-21 VITALS — BP 133/89 | HR 81 | Ht 68.0 in | Wt 194.0 lb

## 2023-11-21 DIAGNOSIS — Z01419 Encounter for gynecological examination (general) (routine) without abnormal findings: Secondary | ICD-10-CM | POA: Diagnosis not present

## 2023-11-21 DIAGNOSIS — Z0000 Encounter for general adult medical examination without abnormal findings: Secondary | ICD-10-CM | POA: Insufficient documentation

## 2023-11-21 DIAGNOSIS — Z1151 Encounter for screening for human papillomavirus (HPV): Secondary | ICD-10-CM | POA: Diagnosis not present

## 2023-11-21 DIAGNOSIS — Z1211 Encounter for screening for malignant neoplasm of colon: Secondary | ICD-10-CM

## 2023-11-21 DIAGNOSIS — Z1212 Encounter for screening for malignant neoplasm of rectum: Secondary | ICD-10-CM

## 2023-11-21 DIAGNOSIS — Z1231 Encounter for screening mammogram for malignant neoplasm of breast: Secondary | ICD-10-CM | POA: Insufficient documentation

## 2023-11-21 DIAGNOSIS — F32A Depression, unspecified: Secondary | ICD-10-CM

## 2023-11-21 DIAGNOSIS — F419 Anxiety disorder, unspecified: Secondary | ICD-10-CM | POA: Diagnosis not present

## 2023-11-21 NOTE — Progress Notes (Signed)
 Patient ID: [name], female   DOB: 1976-02-10, 48 y.o.   MRN: [id number] History of Present Illness: [name] is a 48  year old white female, separated, G3P2012 in for a well woman gyn exam and pap. Last pap was 2013.  PCP is I Polanco.    Current Medications, Allergies, Past Medical History, Past Surgical History, Family History and Social History were reviewed in [employer] record.     Review of Systems: Patient denies any  daily headaches, fatigue, blurred vision, shortness of breath, chest pain, abdominal pain, problems with bowel movements(has hx constipation), urination, or intercourse(not currently active). No mood swings.  She has some hearing loss, with her CP, and has body aches and joint pain. She has skipped 1 period.    Physical Exam:BP 133/89 (BP Location: Right Arm, Patient Position: Sitting, Cuff Size: Normal)   Pulse 81   Ht 5' 8 (1.727 m)   Wt 194 lb (88 kg)   LMP 10/28/2023 (Approximate)   BMI 29.50 kg/m   General:  Well developed, well nourished, no acute distress Skin:  Warm and dry Neck:  Midline trachea, normal thyroid , good ROM, no lymphadenopathy Lungs; Clear to auscultation bilaterally Breast:  No dominant palpable mass, retraction, or nipple discharge Cardiovascular: Regular rate and rhythm Abdomen:  Soft, non tender, no hepatosplenomegaly Pelvic:  External genitalia is normal in appearance, no lesions.  The vagina is normal in appearance, +blood. Urethra has no lesions or masses. The cervix is bulbous.Pap with HR HPV genotyping performed.  Uterus is felt to be normal size, shape, and contour.  No adnexal masses or tenderness noted.Bladder is non tender, no masses felt. Rectal: Deferred  Extremities/musculoskeletal:  No swelling or varicosities noted, no clubbing or cyanosis, left arm and hand affected by CP Psych:  No mood changes, alert and cooperative,seems happy AA is 2    11/21/2023    9:37 AM 10/24/2023    9:34 AM  09/10/2022    8:18 AM  Depression screen PHQ 2/9  Decreased Interest 2 2 3   Down, Depressed, Hopeless 1 0 1  PHQ - 2 Score 3 2 4   Altered sleeping 1 0 1  Tired, decreased energy 3 2 1   Change in appetite 3 1 1   Feeling bad or failure about yourself  2 1 2   Trouble concentrating 1 0 2  Moving slowly or fidgety/restless 0 0 0  Suicidal thoughts 0 0 0  PHQ-9 Score 13 6 11   Difficult doing work/chores  Not difficult at all Not difficult at all       11/21/2023    9:37 AM 10/24/2023    9:34 AM 09/10/2022    8:18 AM  GAD 7 : Generalized Anxiety Score  Nervous, Anxious, on Edge 1 2 2   Control/stop worrying 2 1 1   Worry too much - different things 1 1 2   Trouble relaxing 0 0 0  Restless 0 0 0  Easily annoyed or irritable 1 2 3   Afraid - awful might happen 0 0 0  Total GAD 7 Score 5 6 8   Anxiety Difficulty  Somewhat difficult Somewhat difficult      Upstream - 11/21/23 0934       Pregnancy Intention Screening   Does the patient want to become pregnant in the next year? No    Does the patient's partner want to become pregnant in the next year? No    Would the patient like to discuss contraceptive options today? No  Contraception Wrap Up   Current Method Female Sterilization    End Method Female Sterilization    Contraception Counseling Provided No         Examination chaperoned by Clarita Salt LPN   Impression and plan: 1. Routine general medical examination at a health care facility (Primary) Pap sent Pap in 3 years if negative Physical in 1 year Labs with PCP - Cytology - PAP( [place])  2. Encounter for gynecological examination with Papanicolaou smear of cervix Pap sent   3. Screening mammogram for breast cancer Mammogram scheduled for her at [facility] 12/07/23 at 9:15 am - MM 3D SCREENING MAMMOGRAM BILATERAL BREAST; Future  4. Screening for colorectal cancer Referred for colonoscopy  - Ambulatory referral to Gastroenterology  5. Anxiety and  depression She treated by the [facility] in [place], her visits are virtual,  and she is on medication

## 2023-11-22 ENCOUNTER — Encounter: Payer: Self-pay | Admitting: *Deleted

## 2023-11-23 ENCOUNTER — Ambulatory Visit: Payer: Self-pay | Admitting: Adult Health

## 2023-11-23 LAB — CYTOLOGY - PAP
Adequacy: ABSENT
Comment: NEGATIVE
Diagnosis: NEGATIVE
High risk HPV: NEGATIVE

## 2023-12-07 ENCOUNTER — Ambulatory Visit (HOSPITAL_COMMUNITY)
Admission: RE | Admit: 2023-12-07 | Discharge: 2023-12-07 | Disposition: A | Discharge status: Home / Self Care | Source: Ambulatory Visit | Attending: Adult Health | Admitting: Adult Health

## 2023-12-07 ENCOUNTER — Encounter (HOSPITAL_COMMUNITY): Payer: Self-pay

## 2023-12-07 DIAGNOSIS — Z1231 Encounter for screening mammogram for malignant neoplasm of breast: Secondary | ICD-10-CM | POA: Insufficient documentation

## 2023-12-15 LAB — TSH+FREE T4
Free T4: 1.64 ng/dL (ref 0.82–1.77)
TSH: 0.686 u[IU]/mL (ref 0.450–4.500)

## 2023-12-15 LAB — THYROID PEROXIDASE ANTIBODY: Thyroperoxidase Ab SerPl-aCnc: 188 [IU]/mL — ABNORMAL HIGH (ref 0–34)

## 2023-12-15 LAB — THYROID STIMULATING IMMUNOGLOBULIN: Thyroid Stim Immunoglobulin: <0.1 IU/L (ref 0.00–0.55)

## 2023-12-19 DIAGNOSIS — F411 Generalized anxiety disorder: Secondary | ICD-10-CM | POA: Diagnosis not present

## 2023-12-19 DIAGNOSIS — F900 Attention-deficit hyperactivity disorder, predominantly inattentive type: Secondary | ICD-10-CM | POA: Diagnosis not present

## 2023-12-19 DIAGNOSIS — F329 Major depressive disorder, single episode, unspecified: Secondary | ICD-10-CM | POA: Diagnosis not present
